# Patient Record
Sex: Female | Born: 1990 | Race: Black or African American | Hispanic: No | Marital: Single | Smoking: Never smoker
Health system: Southern US, Community
[De-identification: ages and names within clinical notes are randomized; demographics above are authoritative.]

## PROBLEM LIST (undated history)

## (undated) DIAGNOSIS — J45909 Unspecified asthma, uncomplicated: Secondary | ICD-10-CM

## (undated) DIAGNOSIS — D649 Anemia, unspecified: Secondary | ICD-10-CM

## (undated) HISTORY — DX: Anemia, unspecified: D64.9

---

## 2014-11-19 HISTORY — PX: LYMPH NODE BIOPSY: SHX201

## 2021-01-13 ENCOUNTER — Other Ambulatory Visit: Payer: Self-pay

## 2021-01-13 ENCOUNTER — Encounter: Payer: Self-pay | Admitting: Emergency Medicine

## 2021-01-13 ENCOUNTER — Emergency Department
Admission: EM | Admit: 2021-01-13 | Discharge: 2021-01-13 | Disposition: A | Discharge status: Home / Self Care | Payer: Medicaid Other | Attending: Emergency Medicine | Admitting: Emergency Medicine

## 2021-01-13 ENCOUNTER — Emergency Department: Payer: Medicaid Other

## 2021-01-13 DIAGNOSIS — J45909 Unspecified asthma, uncomplicated: Secondary | ICD-10-CM | POA: Insufficient documentation

## 2021-01-13 DIAGNOSIS — X58XXXA Exposure to other specified factors, initial encounter: Secondary | ICD-10-CM | POA: Diagnosis not present

## 2021-01-13 DIAGNOSIS — S8391XA Sprain of unspecified site of right knee, initial encounter: Secondary | ICD-10-CM | POA: Insufficient documentation

## 2021-01-13 DIAGNOSIS — S8991XA Unspecified injury of right lower leg, initial encounter: Secondary | ICD-10-CM | POA: Diagnosis present

## 2021-01-13 HISTORY — DX: Unspecified asthma, uncomplicated: J45.909

## 2021-01-13 LAB — POC URINE PREG, ED: Preg Test, Ur: NEGATIVE

## 2021-01-13 MED ORDER — IBUPROFEN 800 MG PO TABS
800.0000 mg | ORAL_TABLET | Freq: Once | ORAL | Status: AC
  Administered 2021-01-13: 800 mg via ORAL
  Filled 2021-01-13: qty 1

## 2021-01-13 MED ORDER — CYCLOBENZAPRINE HCL 10 MG PO TABS
5.0000 mg | ORAL_TABLET | Freq: Once | ORAL | Status: AC
  Administered 2021-01-13: 5 mg via ORAL
  Filled 2021-01-13: qty 1

## 2021-01-13 MED ORDER — CYCLOBENZAPRINE HCL 5 MG PO TABS: 5.0000 mg | ORAL_TABLET | Freq: Three times a day (TID) | ORAL | 0 refills | Status: DC | PRN

## 2021-01-13 MED ORDER — IBUPROFEN 800 MG PO TABS
800.0000 mg | ORAL_TABLET | Freq: Three times a day (TID) | ORAL | 0 refills | Status: DC | PRN
Start: 2021-01-13 — End: 2021-02-26

## 2021-01-13 NOTE — Discharge Instructions (Addendum)
Your exam and XR are consistent with a knee sprain. You may have tweaked your knee. Take the prescription meds as directed. Apply ice to reduce swelling. Follow-up with Ortho if your symptoms don't improve in 2 weeks.

## 2021-01-13 NOTE — ED Triage Notes (Signed)
Pt reports pain to her left knee for the past 2 days, denies injuries or falls

## 2021-01-14 NOTE — ED Provider Notes (Signed)
[facility] Emergency Department Provider Note ____________________________________________  Time seen: 2202  I have reviewed the triage vital signs and the nursing notes.  HISTORY  Chief Complaint  Knee Pain  HPI Johan Beazley is a 30 y.o. female presents herself to the ED for evaluation of some left knee pain.   Patient denies any known injury, trauma, or fall.  She reports lateral knee pain of the last 2 days.  She denies any history of chronic ongoing knee pain.  She denies any catching, clicking, locking, or giving way of the knee.  She has not had any medications in the interim for symptom relief.  Past Medical History:  Diagnosis Date  . Asthma     There are no problems to display for this patient.   History reviewed. No pertinent surgical history.  Prior to Admission medications   Medication Sig Start Date End Date Taking? Authorizing Provider  cyclobenzaprine (FLEXERIL) 5 MG tablet Take 1 tablet (5 mg total) by mouth 3 (three) times daily as needed. 01/13/21  Yes Menshew, Charlesetta Ivory, PA-C  ibuprofen (ADVIL) 800 MG tablet Take 1 tablet (800 mg total) by mouth every 8 (eight) hours as needed. 01/13/21  Yes Menshew, Charlesetta Ivory, PA-C    Allergies Patient has no allergy information on record.  History reviewed. No pertinent family history.  Social History Social History   Tobacco Use  . Smoking status: Never Smoker  . Smokeless tobacco: Never Used  Vaping Use  . Vaping Use: Some days  Substance Use Topics  . Alcohol use: Yes    Comment: social    Review of Systems  Constitutional: Negative for fever. Cardiovascular: Negative for chest pain. Respiratory: Negative for shortness of breath. Gastrointestinal: Negative for abdominal pain, vomiting and diarrhea. Genitourinary: Negative for dysuria. Musculoskeletal: Negative for back pain.  Knee pain as above. Skin: Negative for rash. Neurological: Negative for headaches, focal  weakness or numbness. ____________________________________________  PHYSICAL EXAM:  VITAL SIGNS: ED Triage Vitals  Enc Vitals Group     BP 01/13/21 2019 117/72     Pulse Rate 01/13/21 2019 (!) 112     Resp 01/13/21 2019 18     Temp 01/13/21 2019 98.3 F (36.8 C)     Temp Source 01/13/21 2019 Oral     SpO2 01/13/21 2019 100 %     Weight 01/13/21 2017 153 lb (69.4 kg)     Height 01/13/21 2017 5\' 3"  (1.6 m)     Head Circumference --      Peak Flow --      Pain Score 01/13/21 2016 10     Pain Loc --      Pain Edu? --      Excl. in GC? --     Constitutional: Alert and oriented. Well appearing and in no distress. Head: Normocephalic and atraumatic. Eyes: Conjunctivae are normal. Normal extraocular movements Cardiovascular: Normal rate, regular rhythm. Normal distal pulses. Respiratory: Normal respiratory effort.  Musculoskeletal: Left knee without obvious deformity, dislocation, or effusion.  Patient with normal active range of motion of the knee without difficulty.  Normal patella tracking without ballottement.  No significant medial or lateral joint line tenderness.  Patient has not attended manipulation of the lateral collateral ligament.  Negative anterior/posterior drawer sign.  Negative McMurray and Lachman's on exam.  No popliteal space fullness is noted.  No calf or Achilles tenderness noted distally.  Nontender with normal range of motion in all extremities.  Neurologic:  Normal  gait without ataxia. Normal speech and language. No gross focal neurologic deficits are appreciated. Skin:  Skin is warm, dry and intact. No rash noted. Psychiatric: Mood and affect are normal. Patient exhibits appropriate insight and judgment. ____________________________________________   RADIOLOGY  DG Left Knee  Negative  I, Jenise V Bacon-Menshew, personally viewed and evaluated these images (plain radiographs) as part of my medical decision making, as well as reviewing the written report by  the radiologist. ____________________________________________  PROCEDURES  Ace bandage IBU 800 mg PO Flexeril 5 mg PO  Procedures ____________________________________________  INITIAL IMPRESSION / ASSESSMENT AND PLAN / ED COURSE  DDX: knee sprain/strain, effusion, fracture, OA  Patient ED evaluation of a 2-day complaint of nontraumatic left knee pain.  Patient's exam is overall nonreturn at this time.  No signs of acute internal derangement or evidence radiologically of any fracture or dislocation, as I have personally viewed the images.  Patient will be treated for a knee sprain likely to the lateral collateral ligament.  She is placed in Ace bandage for support.  Prescriptions for ibuprofen and Flexeril are provided for benefit.  She is referred to Ortho for ongoing symptomatically.  Return precautions have been reviewed.   Fynn Tapscott was evaluated in Emergency Department on 01/14/2021 for the symptoms described in the history of present illness. She was evaluated in the context of the global COVID-19 pandemic, which necessitated consideration that the patient might be at risk for infection with the SARS-CoV-2 virus that causes COVID-19. Institutional protocols and algorithms that pertain to the evaluation of patients at risk for COVID-19 are in a state of rapid change based on information released by regulatory bodies including the CDC and federal and state organizations. These policies and algorithms were followed during the patient's care in the ED. ____________________________________________  FINAL CLINICAL IMPRESSION(S) / ED DIAGNOSES  Final diagnoses:  Sprain of right knee, unspecified ligament, initial encounter      Lissa Hoard, PA-C 01/14/21 0033    Phineas Semen, MD 01/14/21 1710

## 2021-02-26 ENCOUNTER — Other Ambulatory Visit: Payer: Self-pay

## 2021-02-26 ENCOUNTER — Emergency Department
Admission: EM | Admit: 2021-02-26 | Discharge: 2021-02-26 | Disposition: A | Discharge status: Home / Self Care | Payer: Medicaid Other | Attending: Emergency Medicine | Admitting: Emergency Medicine

## 2021-02-26 DIAGNOSIS — M79601 Pain in right arm: Secondary | ICD-10-CM | POA: Diagnosis not present

## 2021-02-26 DIAGNOSIS — J45909 Unspecified asthma, uncomplicated: Secondary | ICD-10-CM | POA: Insufficient documentation

## 2021-02-26 DIAGNOSIS — M549 Dorsalgia, unspecified: Secondary | ICD-10-CM | POA: Diagnosis not present

## 2021-02-26 DIAGNOSIS — M79602 Pain in left arm: Secondary | ICD-10-CM | POA: Insufficient documentation

## 2021-02-26 LAB — POC URINE PREG, ED: Preg Test, Ur: NEGATIVE

## 2021-02-26 LAB — URINALYSIS, COMPLETE (UACMP) WITH MICROSCOPIC
Bilirubin Urine: NEGATIVE
Glucose, UA: NEGATIVE mg/dL
Hgb urine dipstick: NEGATIVE
Ketones, ur: 5 mg/dL — AB
Leukocytes,Ua: NEGATIVE
Nitrite: NEGATIVE
Protein, ur: NEGATIVE mg/dL
Specific Gravity, Urine: 1.02 (ref 1.005–1.030)
pH: 6 (ref 5.0–8.0)

## 2021-02-26 MED ORDER — KETOROLAC TROMETHAMINE 30 MG/ML IJ SOLN
30.0000 mg | Freq: Once | INTRAMUSCULAR | Status: AC
  Administered 2021-02-26: 30 mg via INTRAMUSCULAR
  Filled 2021-02-26: qty 1

## 2021-02-26 MED ORDER — NAPROXEN 500 MG PO TABS
500.0000 mg | ORAL_TABLET | Freq: Two times a day (BID) | ORAL | 0 refills | Status: DC
Start: 2021-02-26 — End: 2021-03-31

## 2021-02-26 NOTE — ED Triage Notes (Signed)
Pt comes with c/o back pain that just started last night. Pt states pain from top of back to lower back. Pt denies any recent injuries.

## 2021-02-26 NOTE — Discharge Instructions (Signed)
Follow-up with your primary care provider or [facility] acute care if any continued problems or concerns.  A prescription for naproxen 500 mg was sent to the pharmacy to be taken with food every day for inflammation and pain.  You may take Tylenol with this medication if additional pain medication is needed.  Use warm moist compresses to your back as needed for discomfort.

## 2021-02-26 NOTE — ED Notes (Signed)
Pt c/o "shooting" back pains that travel up spine and into b/l arms. Pt c/o bruising where "sharp pains are" on b/l forearms. Minimal bruising noted.

## 2021-02-26 NOTE — ED Provider Notes (Signed)
[facility] Emergency Department Provider Note  ____________________________________________   Event Date/Time   First MD Initiated Contact with Patient 02/26/21 1246     (approximate)  I have reviewed the triage vital signs and the nursing notes.   HISTORY  Chief Complaint Back Pain   HPI Katie Kidd is a 30 y.o. female presents to the ED with complaint of back pain that travels up her spine and into her bilateral arms.  Patient states these are "sharp pains".  She denies any injury to her back and states that this started last night while doing nothing.  Patient reports that she has not take any over-the-counter medication for her pain.  She denies any urinary symptoms or previous back problems.  Patient continues to ambulate without any assistance.  Pain is a 10/10.       Past Medical History:  Diagnosis Date  . Asthma     There are no problems to display for this patient.   History reviewed. No pertinent surgical history.  Prior to Admission medications   Medication Sig Start Date End Date Taking? Authorizing Provider  naproxen (NAPROSYN) 500 MG tablet Take 1 tablet (500 mg total) by mouth 2 (two) times daily with a meal. 02/26/21  Yes Tommi Rumps, PA-C    Allergies Patient has no allergy information on record.  No family history on file.  Social History Social History   Tobacco Use  . Smoking status: Never Smoker  . Smokeless tobacco: Never Used  Vaping Use  . Vaping Use: Some days  Substance Use Topics  . Alcohol use: Yes    Comment: social  . Drug use: Never    Review of Systems Constitutional: No fever/chills Eyes: No visual changes. ENT: No complaints. Cardiovascular: Denies chest pain. Respiratory: Denies shortness of breath. Gastrointestinal: No abdominal pain.  No nausea, no vomiting.  Genitourinary: Negative for dysuria. Musculoskeletal: Positive for generalized back pain. Skin: Negative for  rash. Neurological: Negative for headaches, focal weakness or numbness.  ____________________________________________   PHYSICAL EXAM:  VITAL SIGNS: ED Triage Vitals  Enc Vitals Group     BP 02/26/21 1212 134/85     Pulse Rate 02/26/21 1212 (!) 105     Resp 02/26/21 1212 18     Temp 02/26/21 1212 98.2 F (36.8 C)     Temp Source 02/26/21 1212 Oral     SpO2 02/26/21 1212 100 %     Weight 02/26/21 1210 127 lb (57.6 kg)     Height 02/26/21 1210 5\' 3"  (1.6 m)     Head Circumference --      Peak Flow --      Pain Score 02/26/21 1210 10     Pain Loc --      Pain Edu? --      Excl. in GC? --     Constitutional: Alert and oriented. Well appearing and in no acute distress. Eyes: Conjunctivae are normal.  Head: Atraumatic. Neck: No stridor.   Cardiovascular: Normal rate, regular rhythm. Grossly normal heart sounds.  Good peripheral circulation. Respiratory: Normal respiratory effort.  No retractions. Lungs CTAB. Gastrointestinal: Soft and nontender. No distention.  No CVA tenderness. Musculoskeletal: Patient is able move upper and lower extremities with any difficulty.  There is generalized tenderness to the back both muscular and skeletal.  No rashes noted.  No abrasions or ecchymosis is present.  No soft tissue edema.  Range of motion is unrestricted.  Patient is ambulatory without any assistance.  Good muscle strength bilaterally. Neurologic:  Normal speech and language. No gross focal neurologic deficits are appreciated.  Skin:  Skin is warm, dry and intact. No rash noted. Psychiatric: Mood and affect are normal. Speech and behavior are normal.  ____________________________________________   LABS (all labs ordered are listed, but only abnormal results are displayed)  Labs Reviewed  URINALYSIS, COMPLETE (UACMP) WITH MICROSCOPIC - Abnormal; Notable for the following components:      Result Value   Color, Urine YELLOW (*)    APPearance HAZY (*)    Ketones, ur 5 (*)     Bacteria, UA RARE (*)    All other components within normal limits  POC URINE PREG, ED    PROCEDURES  Procedure(s) performed (including Critical Care):  Procedures   ____________________________________________   INITIAL IMPRESSION / ASSESSMENT AND PLAN / ED COURSE  As part of my medical decision making, I reviewed the following data within the electronic MEDICAL RECORD NUMBER Notes from prior ED visits and [place] Controlled Substance Database  30 year old female presents to the ED with complaint of back pain that began last evening without history of injury.  Patient denies any urinary symptoms and urine was negative.  On exam patient has generalized diffuse tenderness on palpation of the muscles bilaterally.  Range of motion is without restriction and no muscle spasms are noted.  Good muscle strength bilaterally.  No point tenderness on palpation of thoracic or lumbar spine.  Patient has not tried any over-the-counter medications and therefore was given an injection of Toradol 30 mg IM.  A prescription for naproxen 500 mg twice daily was sent to her pharmacy.  She is encouraged to use heat to her back as needed for discomfort and follow-up with her PCP if any continued problems.  ____________________________________________   FINAL CLINICAL IMPRESSION(S) / ED DIAGNOSES  Final diagnoses:  Musculoskeletal back pain     ED Discharge Orders         Ordered    naproxen (NAPROSYN) 500 MG tablet  2 times daily with meals        02/26/21 1428          *Please note:  Madelina Steuck was evaluated in Emergency Department on 02/26/2021 for the symptoms described in the history of present illness. She was evaluated in the context of the global COVID-19 pandemic, which necessitated consideration that the patient might be at risk for infection with the SARS-CoV-2 virus that causes COVID-19. Institutional protocols and algorithms that pertain to the evaluation of patients at risk for COVID-19  are in a state of rapid change based on information released by regulatory bodies including the CDC and federal and state organizations. These policies and algorithms were followed during the patient's care in the ED.  Some ED evaluations and interventions may be delayed as a result of limited staffing during and the pandemic.*   Note:  This document was prepared using Dragon voice recognition software and may include unintentional dictation errors.    Tommi Rumps, PA-C 02/26/21 1526    Dionne Bucy, MD 02/26/21 1529

## 2021-03-19 HISTORY — PX: CHOLECYSTECTOMY: SHX55

## 2021-03-26 ENCOUNTER — Emergency Department: Payer: Medicaid Other

## 2021-03-26 ENCOUNTER — Other Ambulatory Visit: Payer: Self-pay

## 2021-03-26 ENCOUNTER — Emergency Department
Admission: EM | Admit: 2021-03-26 | Discharge: 2021-03-26 | Disposition: A | Discharge status: Home / Self Care | Payer: Medicaid Other | Attending: Student in an Organized Health Care Education/Training Program | Admitting: Student in an Organized Health Care Education/Training Program

## 2021-03-26 DIAGNOSIS — J45909 Unspecified asthma, uncomplicated: Secondary | ICD-10-CM | POA: Diagnosis not present

## 2021-03-26 DIAGNOSIS — R079 Chest pain, unspecified: Secondary | ICD-10-CM | POA: Diagnosis present

## 2021-03-26 DIAGNOSIS — M25512 Pain in left shoulder: Secondary | ICD-10-CM | POA: Diagnosis not present

## 2021-03-26 DIAGNOSIS — R0789 Other chest pain: Secondary | ICD-10-CM | POA: Diagnosis not present

## 2021-03-26 DIAGNOSIS — R000 Tachycardia, unspecified: Secondary | ICD-10-CM | POA: Insufficient documentation

## 2021-03-26 LAB — CBC WITH DIFFERENTIAL/PLATELET
Abs Immature Granulocytes: 0.02 10*3/uL (ref 0.00–0.07)
Basophils Absolute: 0 10*3/uL (ref 0.0–0.1)
Basophils Relative: 0 %
Eosinophils Absolute: 0.1 10*3/uL (ref 0.0–0.5)
Eosinophils Relative: 1 %
HCT: 32.7 % — ABNORMAL LOW (ref 36.0–46.0)
Hemoglobin: 9.4 g/dL — ABNORMAL LOW (ref 12.0–15.0)
Immature Granulocytes: 0 %
Lymphocytes Relative: 33 %
Lymphs Abs: 2.5 10*3/uL (ref 0.7–4.0)
MCH: 18.6 pg — ABNORMAL LOW (ref 26.0–34.0)
MCHC: 28.7 g/dL — ABNORMAL LOW (ref 30.0–36.0)
MCV: 64.6 fL — ABNORMAL LOW (ref 80.0–100.0)
Monocytes Absolute: 0.5 10*3/uL (ref 0.1–1.0)
Monocytes Relative: 7 %
Neutro Abs: 4.3 10*3/uL (ref 1.7–7.7)
Neutrophils Relative %: 59 %
Platelets: 568 10*3/uL — ABNORMAL HIGH (ref 150–400)
RBC: 5.06 MIL/uL (ref 3.87–5.11)
RDW: 17.8 % — ABNORMAL HIGH (ref 11.5–15.5)
WBC: 7.4 10*3/uL (ref 4.0–10.5)
nRBC: 0 % (ref 0.0–0.2)

## 2021-03-26 LAB — TROPONIN I (HIGH SENSITIVITY)
Troponin I (High Sensitivity): <2 ng/L (ref <18)
Troponin I (High Sensitivity): <2 ng/L (ref <18)

## 2021-03-26 LAB — COMPREHENSIVE METABOLIC PANEL
ALT: 33 U/L (ref 0–44)
AST: 83 U/L — ABNORMAL HIGH (ref 15–41)
Albumin: 4.5 g/dL (ref 3.5–5.0)
Alkaline Phosphatase: 77 U/L (ref 38–126)
Anion gap: 11 (ref 5–15)
BUN: 9 mg/dL (ref 6–20)
CO2: 21 mmol/L — ABNORMAL LOW (ref 22–32)
Calcium: 9.3 mg/dL (ref 8.9–10.3)
Chloride: 107 mmol/L (ref 98–111)
Creatinine, Ser: 0.73 mg/dL (ref 0.44–1.00)
GFR, Estimated: >60 mL/min (ref >60)
Glucose, Bld: 104 mg/dL — ABNORMAL HIGH (ref 70–99)
Potassium: 3.6 mmol/L (ref 3.5–5.1)
Sodium: 139 mmol/L (ref 135–145)
Total Bilirubin: 0.8 mg/dL (ref 0.3–1.2)
Total Protein: 8.6 g/dL — ABNORMAL HIGH (ref 6.5–8.1)

## 2021-03-26 LAB — POC URINE PREG, ED: Preg Test, Ur: NEGATIVE

## 2021-03-26 MED ORDER — ALUM & MAG HYDROXIDE-SIMETH 200-200-20 MG/5ML PO SUSP
30.0000 mL | Freq: Once | ORAL | Status: AC
  Administered 2021-03-26: 30 mL via ORAL
  Filled 2021-03-26: qty 30

## 2021-03-26 MED ORDER — HYDROCODONE-ACETAMINOPHEN 5-325 MG PO TABS
1.0000 | ORAL_TABLET | ORAL | 0 refills | Status: DC | PRN
Start: 2021-03-26 — End: 2021-03-31

## 2021-03-26 MED ORDER — MORPHINE SULFATE (PF) 4 MG/ML IV SOLN
4.0000 mg | INTRAVENOUS | Status: DC | PRN
  Administered 2021-03-26: 4 mg via INTRAVENOUS
  Filled 2021-03-26: qty 1

## 2021-03-26 MED ORDER — KETOROLAC TROMETHAMINE 30 MG/ML IJ SOLN
15.0000 mg | Freq: Once | INTRAMUSCULAR | Status: AC
  Administered 2021-03-26: 15 mg via INTRAVENOUS
  Filled 2021-03-26: qty 1

## 2021-03-26 MED ORDER — ONDANSETRON HCL 4 MG/2ML IJ SOLN
4.0000 mg | Freq: Once | INTRAMUSCULAR | Status: AC
  Administered 2021-03-26: 4 mg via INTRAVENOUS
  Filled 2021-03-26: qty 2

## 2021-03-26 MED ORDER — IOHEXOL 350 MG/ML SOLN
75.0000 mL | Freq: Once | INTRAVENOUS | Status: AC | PRN
  Administered 2021-03-26: 75 mL via INTRAVENOUS

## 2021-03-26 MED ORDER — OXYCODONE-ACETAMINOPHEN 5-325 MG PO TABS
1.0000 | ORAL_TABLET | Freq: Once | ORAL | Status: AC
Start: 2021-03-26 — End: 2021-03-26
  Administered 2021-03-26: 1 via ORAL
  Filled 2021-03-26: qty 1

## 2021-03-26 MED ORDER — LIDOCAINE VISCOUS HCL 2 % MT SOLN
15.0000 mL | Freq: Once | OROMUCOSAL | Status: AC
  Administered 2021-03-26: 15 mL via ORAL
  Filled 2021-03-26: qty 15

## 2021-03-26 NOTE — ED Triage Notes (Signed)
Patient from home via [facility] with c/o intermittent chest pressure, left arm pain and upper abdominal pain.  EMS reports patient "felt these sx at 7pm last night, went to bed and woke up to use the restroom and the pain returned".  Patient is A&O x4, able to move self to stretcher, NAD noted.

## 2021-03-26 NOTE — ED Notes (Signed)
Responded to call bell. Pt needs to use bathroom and able to ambulate without assistance. Pt returned safely to stretcher and resting safely.

## 2021-03-26 NOTE — ED Provider Notes (Signed)
[facility] Emergency Department Provider Note    Event Date/Time   First MD Initiated Contact with Patient 03/26/21 0206     (approximate)  I have reviewed the triage vital signs and the nursing notes.   HISTORY  Chief Complaint Chest Pain    HPI Katie Kidd is a 30 y.o. 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  No associated nausea or vomiting or diaphoresis.  Started this afternoon.  Did slightly improve and then patient woke up this evening with more severe discomfort.  Does have some discomfort when taking deep inspiration.  Denies any lower extremity swelling.  Not on birth control.   Past Medical History:  Diagnosis Date  . Asthma    No family history on file. History reviewed. No pertinent surgical history. There are no problems to display for this patient.     Prior to Admission medications   Medication Sig Start Date End Date Taking? Authorizing Provider  HYDROcodone-acetaminophen (NORCO) 5-325 MG tablet Take 1 tablet by mouth every 4 (four) hours as needed for moderate pain. 03/26/21  Yes Willy Eddy, MD  naproxen (NAPROSYN) 500 MG tablet Take 1 tablet (500 mg total) by mouth 2 (two) times daily with a meal. 02/26/21   Tommi Rumps, PA-C    Allergies Patient has no allergy information on record.    Social History Social History   Tobacco Use  . Smoking status: Never Smoker  . Smokeless tobacco: Never Used  Vaping Use  . Vaping Use: Some days  Substance Use Topics  . Alcohol use: Yes    Comment: social  . Drug use: Never    Review of Systems Patient denies headaches, rhinorrhea, blurry vision, numbness, shortness of breath, chest pain, edema, cough, abdominal pain, nausea, vomiting, diarrhea, dysuria, fevers, rashes or hallucinations unless  otherwise stated above in HPI. ____________________________________________   PHYSICAL EXAM:  VITAL SIGNS: Vitals:   03/26/21 0530 03/26/21 0600  BP: 103/70 113/82  Pulse: 70 71  Resp: 14 13  Temp:    SpO2: 98% 100%    Constitutional: Alert and oriented.  Eyes: Conjunctivae are normal.  Head: Atraumatic. Nose: No congestion/rhinnorhea. Mouth/Throat: Mucous membranes are moist.   Neck: No stridor. Painless ROM.  Cardiovascular: Normal rate, regular rhythm. Grossly normal heart sounds.  Good peripheral circulation. Respiratory: Normal respiratory effort.  No retractions. Lungs CTAB. Gastrointestinal: Soft and nontender. No distention. No abdominal bruits. No CVA tenderness. Genitourinary:  Musculoskeletal: No lower extremity tenderness nor edema.  No joint effusions. Neurologic:  Normal speech and language. No gross focal neurologic deficits are appreciated. No facial droop Skin:  Skin is warm, dry and intact. No rash noted. Psychiatric: Mood and affect are normal. Speech and behavior are normal.  ____________________________________________   LABS (all labs ordered are listed, but only abnormal results are displayed)  Results for orders placed or performed during the hospital encounter of 03/26/21 (from the past 24 hour(s))  CBC with Differential     Status: Abnormal   Collection Time: 03/26/21  2:32 AM  Result Value Ref Range   WBC 7.4 4.0 - 10.5 K/uL   RBC 5.06 3.87 - 5.11 MIL/uL   Hemoglobin 9.4 (L) 12.0 - 15.0 g/dL   HCT 71.2 (L) 45.8 - 09.9 %   MCV 64.6 (L) 80.0 - 100.0 fL  MCH 18.6 (L) 26.0 - 34.0 pg   MCHC 28.7 (L) 30.0 - 36.0 g/dL   RDW 00.8 (H) 67.6 - 19.5 %   Platelets 568 (H) 150 - 400 K/uL   nRBC 0.0 0.0 - 0.2 %   Neutrophils Relative % 59 %   Neutro Abs 4.3 1.7 - 7.7 K/uL   Lymphocytes Relative 33 %   Lymphs Abs 2.5 0.7 - 4.0 K/uL   Monocytes Relative 7 %   Monocytes Absolute 0.5 0.1 - 1.0 K/uL   Eosinophils Relative 1 %   Eosinophils Absolute 0.1  0.0 - 0.5 K/uL   Basophils Relative 0 %   Basophils Absolute 0.0 0.0 - 0.1 K/uL   Immature Granulocytes 0 %   Abs Immature Granulocytes 0.02 0.00 - 0.07 K/uL  Comprehensive metabolic panel     Status: Abnormal   Collection Time: 03/26/21  2:32 AM  Result Value Ref Range   Sodium 139 135 - 145 mmol/L   Potassium 3.6 3.5 - 5.1 mmol/L   Chloride 107 98 - 111 mmol/L   CO2 21 (L) 22 - 32 mmol/L   Glucose, Bld 104 (H) 70 - 99 mg/dL   BUN 9 6 - 20 mg/dL   Creatinine, Ser 0.93 0.44 - 1.00 mg/dL   Calcium 9.3 8.9 - 26.7 mg/dL   Total Protein 8.6 (H) 6.5 - 8.1 g/dL   Albumin 4.5 3.5 - 5.0 g/dL   AST 83 (H) 15 - 41 U/L   ALT 33 0 - 44 U/L   Alkaline Phosphatase 77 38 - 126 U/L   Total Bilirubin 0.8 0.3 - 1.2 mg/dL   GFR, Estimated >12 >45 mL/min   Anion gap 11 5 - 15  Troponin I (High Sensitivity)     Status: None   Collection Time: 03/26/21  2:32 AM  Result Value Ref Range   Troponin I (High Sensitivity) <2 <18 ng/L  POC Urine Pregnancy, ED     Status: None   Collection Time: 03/26/21  4:59 AM  Result Value Ref Range   Preg Test, Ur Negative Negative  Troponin I (High Sensitivity)     Status: None   Collection Time: 03/26/21  5:48 AM  Result Value Ref Range   Troponin I (High Sensitivity) <2 <18 ng/L   ____________________________________________  EKG My review and personal interpretation at Time: 2:08   Indication: chesrt pain  Rate: 115  Rhythm: sinus Axis: normal Other: normal intervals, no stemi, no depressions ____________________________________________  RADIOLOGY  I personally reviewed all radiographic images ordered to evaluate for the above acute complaints and reviewed radiology reports and findings.  These findings were personally discussed with the patient.  Please see medical record for radiology report.  ____________________________________________   PROCEDURES  Procedure(s) performed:  Procedures    Critical Care performed:  no ____________________________________________   INITIAL IMPRESSION / ASSESSMENT AND PLAN / ED COURSE  Pertinent labs & imaging results that were available during my care of the patient were reviewed by me and considered in my medical decision making (see chart for details).   DDX: ACS, pericarditis, esophagitis, boerhaaves, pe, dissection, pna, bronchitis, costochondritis   Katie Kidd is a 30 y.o. who presents to the ED with presentation as described above.  Patient clinically well-appearing in no acute distress.  Describing symptoms as above.  EKG with mild tachycardia but no signs of ischemia.  She is low risk by heart score will order serial enzymes.  Initial troponin negative.  Given her history of DVT  PE not currently on anticoagulation will order CTA to evaluate for the same.  Will give pain medication.  Abdominal exam soft and benign.  The patient will be placed on continuous pulse oximetry and telemetry for monitoring.  Laboratory evaluation will be sent to evaluate for the above complaints.     Clinical Course as of 03/26/21 7989  Katie Kidd Mar 26, 2021  2119 CT imaging is reassuring.  Initial troponin negative.  No sign of infiltrates.  Peers well in no distress.  We continue to observe.  Will repeat troponin. [PR]  0640 Reassessed.  Repeat troponin negative.  Still having some mild discomfort but seems noncardiac in nature.  Discussed my recommendation for taking anti-inflammatory medication and will prescribe short course of pain medication.  We discussed follow-up with PCP.  Discussed signs and symptoms which she should return to the ER. [PR]    Clinical Course User Index [PR] Willy Eddy, MD    The patient was evaluated in Emergency Department today for the symptoms described in the history of present illness. He/she was evaluated in the context of the global COVID-19 pandemic, which necessitated consideration that the patient might be at risk for infection with the  SARS-CoV-2 virus that causes COVID-19. Institutional protocols and algorithms that pertain to the evaluation of patients at risk for COVID-19 are in a state of rapid change based on information released by regulatory bodies including the CDC and federal and state organizations. These policies and algorithms were followed during the patient's care in the ED.  As part of my medical decision making, I reviewed the following data within the electronic MEDICAL RECORD NUMBER Nursing notes reviewed and incorporated, Labs reviewed, notes from prior ED visits and [place] Controlled Substance Database   ____________________________________________   FINAL CLINICAL IMPRESSION(S) / ED DIAGNOSES  Final diagnoses:  Atypical chest pain      NEW MEDICATIONS STARTED DURING THIS VISIT:  New Prescriptions   HYDROCODONE-ACETAMINOPHEN (NORCO) 5-325 MG TABLET    Take 1 tablet by mouth every 4 (four) hours as needed for moderate pain.     Note:  This document was prepared using Dragon voice recognition software and may include unintentional dictation errors.    Willy Eddy, MD 03/26/21 [phone]

## 2021-03-26 NOTE — ED Notes (Signed)
Pt reports left side chest pain is still at 9/10 approx 1 hour after GI cocktail.

## 2021-03-27 ENCOUNTER — Other Ambulatory Visit: Payer: Self-pay

## 2021-03-27 ENCOUNTER — Emergency Department: Payer: Medicaid Other

## 2021-03-27 ENCOUNTER — Inpatient Hospital Stay
Admission: EM | Admit: 2021-03-27 | Discharge: 2021-03-31 | DRG: 418 | Disposition: A | Discharge status: Home / Self Care | Payer: Medicaid Other | Attending: General Surgery | Admitting: General Surgery

## 2021-03-27 DIAGNOSIS — R932 Abnormal findings on diagnostic imaging of liver and biliary tract: Secondary | ICD-10-CM | POA: Diagnosis not present

## 2021-03-27 DIAGNOSIS — Z86711 Personal history of pulmonary embolism: Secondary | ICD-10-CM

## 2021-03-27 DIAGNOSIS — K819 Cholecystitis, unspecified: Secondary | ICD-10-CM | POA: Diagnosis present

## 2021-03-27 DIAGNOSIS — R1011 Right upper quadrant pain: Secondary | ICD-10-CM

## 2021-03-27 DIAGNOSIS — K8000 Calculus of gallbladder with acute cholecystitis without obstruction: Secondary | ICD-10-CM | POA: Diagnosis present

## 2021-03-27 DIAGNOSIS — E876 Hypokalemia: Secondary | ICD-10-CM | POA: Diagnosis present

## 2021-03-27 DIAGNOSIS — R1010 Upper abdominal pain, unspecified: Secondary | ICD-10-CM | POA: Diagnosis not present

## 2021-03-27 DIAGNOSIS — Z20822 Contact with and (suspected) exposure to covid-19: Secondary | ICD-10-CM | POA: Diagnosis present

## 2021-03-27 DIAGNOSIS — K838 Other specified diseases of biliary tract: Secondary | ICD-10-CM

## 2021-03-27 DIAGNOSIS — K660 Peritoneal adhesions (postprocedural) (postinfection): Secondary | ICD-10-CM | POA: Diagnosis present

## 2021-03-27 DIAGNOSIS — K851 Biliary acute pancreatitis without necrosis or infection: Secondary | ICD-10-CM | POA: Diagnosis present

## 2021-03-27 DIAGNOSIS — Z86718 Personal history of other venous thrombosis and embolism: Secondary | ICD-10-CM

## 2021-03-27 LAB — BASIC METABOLIC PANEL
Anion gap: 9 (ref 5–15)
BUN: 10 mg/dL (ref 6–20)
CO2: 21 mmol/L — ABNORMAL LOW (ref 22–32)
Calcium: 9 mg/dL (ref 8.9–10.3)
Chloride: 105 mmol/L (ref 98–111)
Creatinine, Ser: 0.82 mg/dL (ref 0.44–1.00)
GFR, Estimated: >60 mL/min (ref >60)
Glucose, Bld: 141 mg/dL — ABNORMAL HIGH (ref 70–99)
Potassium: 3.8 mmol/L (ref 3.5–5.1)
Sodium: 135 mmol/L (ref 135–145)

## 2021-03-27 LAB — TROPONIN I (HIGH SENSITIVITY): Troponin I (High Sensitivity): <2 ng/L (ref <18)

## 2021-03-27 LAB — HEPATIC FUNCTION PANEL
ALT: 238 U/L — ABNORMAL HIGH (ref 0–44)
AST: 405 U/L — ABNORMAL HIGH (ref 15–41)
Albumin: 4.2 g/dL (ref 3.5–5.0)
Alkaline Phosphatase: 96 U/L (ref 38–126)
Bilirubin, Direct: 1.6 mg/dL — ABNORMAL HIGH (ref 0.0–0.2)
Indirect Bilirubin: 1.6 mg/dL — ABNORMAL HIGH (ref 0.3–0.9)
Total Bilirubin: 3.2 mg/dL — ABNORMAL HIGH (ref 0.3–1.2)
Total Protein: 8.2 g/dL — ABNORMAL HIGH (ref 6.5–8.1)

## 2021-03-27 LAB — CBC
HCT: 31.1 % — ABNORMAL LOW (ref 36.0–46.0)
Hemoglobin: 8.9 g/dL — ABNORMAL LOW (ref 12.0–15.0)
MCH: 18.5 pg — ABNORMAL LOW (ref 26.0–34.0)
MCHC: 28.6 g/dL — ABNORMAL LOW (ref 30.0–36.0)
MCV: 64.7 fL — ABNORMAL LOW (ref 80.0–100.0)
Platelets: 449 10*3/uL — ABNORMAL HIGH (ref 150–400)
RBC: 4.81 MIL/uL (ref 3.87–5.11)
RDW: 17.6 % — ABNORMAL HIGH (ref 11.5–15.5)
WBC: 7.5 10*3/uL (ref 4.0–10.5)
nRBC: 0 % (ref 0.0–0.2)

## 2021-03-27 LAB — LIPASE, BLOOD: Lipase: 1771 U/L — ABNORMAL HIGH (ref 11–51)

## 2021-03-27 LAB — RESP PANEL BY RT-PCR (FLU A&B, COVID) ARPGX2
Influenza A by PCR: NEGATIVE
Influenza B by PCR: NEGATIVE
SARS Coronavirus 2 by RT PCR: NEGATIVE

## 2021-03-27 LAB — POC URINE PREG, ED: Preg Test, Ur: NEGATIVE

## 2021-03-27 MED ORDER — HYDROMORPHONE HCL 1 MG/ML IJ SOLN
0.5000 mg | INTRAMUSCULAR | Status: DC | PRN
  Administered 2021-03-27 – 2021-03-30 (×10): 1 mg via INTRAVENOUS
  Filled 2021-03-27 (×10): qty 1

## 2021-03-27 MED ORDER — OXYCODONE HCL 5 MG PO TABS
5.0000 mg | ORAL_TABLET | ORAL | Status: DC | PRN
  Administered 2021-03-27 – 2021-03-31 (×9): 10 mg via ORAL
  Filled 2021-03-27 (×9): qty 2

## 2021-03-27 MED ORDER — ENOXAPARIN SODIUM 40 MG/0.4ML IJ SOSY
40.0000 mg | PREFILLED_SYRINGE | INTRAMUSCULAR | Status: DC
  Administered 2021-03-27 – 2021-03-28 (×2): 40 mg via SUBCUTANEOUS
  Filled 2021-03-27 (×2): qty 0.4

## 2021-03-27 MED ORDER — HYDROMORPHONE HCL 1 MG/ML IJ SOLN: 0.5000 mg | INTRAMUSCULAR | Status: DC | PRN

## 2021-03-27 MED ORDER — SODIUM CHLORIDE 0.9 % IV SOLN: INTRAVENOUS | Status: DC

## 2021-03-27 MED ORDER — INDOCYANINE GREEN 25 MG IV SOLR: 7.5000 mg | INTRAVENOUS | Status: DC

## 2021-03-27 MED ORDER — ONDANSETRON HCL 4 MG/2ML IJ SOLN
4.0000 mg | Freq: Four times a day (QID) | INTRAMUSCULAR | Status: DC | PRN
  Administered 2021-03-27 – 2021-03-30 (×5): 4 mg via INTRAVENOUS
  Filled 2021-03-27 (×5): qty 2

## 2021-03-27 MED ORDER — MORPHINE SULFATE (PF) 4 MG/ML IV SOLN
4.0000 mg | Freq: Once | INTRAVENOUS | Status: AC
  Administered 2021-03-27: 4 mg via INTRAVENOUS
  Filled 2021-03-27: qty 1

## 2021-03-27 MED ORDER — ONDANSETRON HCL 4 MG/2ML IJ SOLN
4.0000 mg | Freq: Once | INTRAMUSCULAR | Status: AC
  Administered 2021-03-27: 4 mg via INTRAVENOUS
  Filled 2021-03-27: qty 2

## 2021-03-27 MED ORDER — ACETAMINOPHEN 325 MG PO TABS: 650.0000 mg | ORAL_TABLET | Freq: Four times a day (QID) | ORAL | Status: DC | PRN

## 2021-03-27 MED ORDER — GADOBUTROL 1 MMOL/ML IV SOLN
5.0000 mL | Freq: Once | INTRAVENOUS | Status: AC | PRN
  Administered 2021-03-27: 5 mL via INTRAVENOUS

## 2021-03-27 MED ORDER — SODIUM CHLORIDE 0.9 % IV SOLN
2.0000 g | INTRAVENOUS | Status: DC
  Filled 2021-03-27: qty 20

## 2021-03-27 MED ORDER — KETOROLAC TROMETHAMINE 30 MG/ML IJ SOLN
30.0000 mg | Freq: Four times a day (QID) | INTRAMUSCULAR | Status: DC | PRN
  Administered 2021-03-27 – 2021-03-29 (×5): 30 mg via INTRAVENOUS
  Filled 2021-03-27 (×6): qty 1

## 2021-03-27 MED ORDER — PIPERACILLIN-TAZOBACTAM 3.375 G IVPB
3.3750 g | Freq: Three times a day (TID) | INTRAVENOUS | Status: DC
  Administered 2021-03-27 – 2021-03-30 (×10): 3.375 g via INTRAVENOUS
  Filled 2021-03-27 (×10): qty 50

## 2021-03-27 MED ORDER — PIPERACILLIN-TAZOBACTAM 3.375 G IVPB 30 MIN: 3.3750 g | Freq: Three times a day (TID) | INTRAVENOUS | Status: DC

## 2021-03-27 MED ORDER — ONDANSETRON 4 MG PO TBDP: 4.0000 mg | ORAL_TABLET | Freq: Four times a day (QID) | ORAL | Status: DC | PRN

## 2021-03-27 NOTE — Plan of Care (Signed)
Reviewed with patient

## 2021-03-27 NOTE — ED Triage Notes (Signed)
Pt comes with c/o severe CP, rib pain and epigastric pain. Pt states this started the other night. Pt states it has continued to get worse.  Pt states some nausea and SOB. Pt denies any vomiting or diarrhea.

## 2021-03-27 NOTE — ED Provider Notes (Signed)
[facility] Emergency Department Provider Note   ____________________________________________    I have reviewed the triage vital signs and the nursing notes.   HISTORY  Chief Complaint Chest Pain     HPI Katie Kidd is a 30 y.o. female who presents with complaints of right upper quadrant and chest discomfort.  Patient notes she was seen here recently, confirmed seen overnight on the eighth per medical records, with similar complaints, they did improve however now they have worsened again.  She describes right upper quadrant pain as well as right-sided chest pain.  Review of medical records demonstrates the patient had a normal CT angiography, normal troponins x2 during previous visit.  Has taken prescribed occasions with little improvement today.  No history of abdominal surgery  Past Medical History:  Diagnosis Date  . Asthma     Patient Active Problem List   Diagnosis Date Noted  . Cholecystitis 03/27/2021    History reviewed. No pertinent surgical history.  Prior to Admission medications   Medication Sig Start Date End Date Taking? Authorizing Provider  HYDROcodone-acetaminophen (NORCO) 5-325 MG tablet Take 1 tablet by mouth every 4 (four) hours as needed for moderate pain. 03/26/21   Willy Eddy, MD  naproxen (NAPROSYN) 500 MG tablet Take 1 tablet (500 mg total) by mouth 2 (two) times daily with a meal. 02/26/21   Tommi Rumps, PA-C     Allergies Patient has no allergy information on record.  No family history on file.  Social History Social History   Tobacco Use  . Smoking status: Never Smoker  . Smokeless tobacco: Never Used  Vaping Use  . Vaping Use: Some days  Substance Use Topics  . Alcohol use: Not Currently    Comment: social  . Drug use: Never    Review of Systems  Constitutional: No fever/chills Eyes: No visual changes.  ENT: No sore throat. Cardiovascular: As above Respiratory: Denies shortness  of breath. Gastrointestinal: As above Genitourinary: Negative for dysuria. Musculoskeletal: Negative for back pain. Skin: Negative for rash. Neurological: Negative for headaches   ____________________________________________   PHYSICAL EXAM:  VITAL SIGNS: ED Triage Vitals  Enc Vitals Group     BP 03/27/21 0737 125/83     Pulse Rate 03/27/21 0737 (!) 111     Resp 03/27/21 0737 (!) 22     Temp 03/27/21 0737 97.9 F (36.6 C)     Temp src --      SpO2 03/27/21 0737 99 %     Weight 03/27/21 0735 56.7 kg (125 lb)     Height 03/27/21 0735 1.6 m (5\' 3" )     Head Circumference --      Peak Flow --      Pain Score 03/27/21 0735 10     Pain Loc --      Pain Edu? --      Excl. in GC? --     Constitutional: Alert and oriented.   Nose: No congestion/rhinnorhea. Mouth/Throat: Mucous membranes are moist.    Cardiovascular: Normal rate, regular rhythm.   Good peripheral circulation. Respiratory: Normal respiratory effort.  No retractions. Gastrointestinal: Mild tenderness right upper quadrant, no distention.  No CVA tenderness.  Musculoskeletal: No lower extremity tenderness nor edema.  Warm and well perfused Neurologic:  Normal speech and language. No gross focal neurologic deficits are appreciated.  Skin:  Skin is warm, dry and intact. No rash noted. Psychiatric: Mood and affect are normal. Speech and behavior are normal.  ____________________________________________  LABS (all labs ordered are listed, but only abnormal results are displayed)  Labs Reviewed  BASIC METABOLIC PANEL - Abnormal; Notable for the following components:      Result Value   CO2 21 (*)    Glucose, Bld 141 (*)    All other components within normal limits  CBC - Abnormal; Notable for the following components:   Hemoglobin 8.9 (*)    HCT 31.1 (*)    MCV 64.7 (*)    MCH 18.5 (*)    MCHC 28.6 (*)    RDW 17.6 (*)    Platelets 449 (*)    All other components within normal limits  HEPATIC FUNCTION  PANEL - Abnormal; Notable for the following components:   Total Protein 8.2 (*)    AST 405 (*)    ALT 238 (*)    Total Bilirubin 3.2 (*)    Bilirubin, Direct 1.6 (*)    Indirect Bilirubin 1.6 (*)    All other components within normal limits  RESP PANEL BY RT-PCR (FLU A&B, COVID) ARPGX2  POC URINE PREG, ED  TROPONIN I (HIGH SENSITIVITY)  TROPONIN I (HIGH SENSITIVITY)   ____________________________________________  EKG  ED ECG REPORT I, Jene Every, the attending physician, personally viewed and interpreted this ECG.  Date: 03/27/2021  Rhythm: Sinus tachycardia QRS Axis: normal Intervals: normal ST/T Wave abnormalities: normal Narrative Interpretation: no evidence of acute ischemia  ____________________________________________  RADIOLOGY  Ultrasound reviewed by me, suspicious for cholecystitis ____________________________________________   PROCEDURES  Procedure(s) performed: No  Procedures   Critical Care performed: No ____________________________________________   INITIAL IMPRESSION / ASSESSMENT AND PLAN / ED COURSE  Pertinent labs & imaging results that were available during my care of the patient were reviewed by me and considered in my medical decision making (see chart for details).  Patient presents with right upper quadrant/chest pain as described above, had extensive cardiac work-up over the last 24 hours which was reassuring, low risk for ACS, not consistent with PE given normal CTA  Patient whether this may be a gallbladder issue, will send for ultrasound, IV morphine, IV Zofran ordered  Ultrasound is concerning, will add on LFTs.  Will consult surgery  Surgery will admit the patient    ____________________________________________   FINAL CLINICAL IMPRESSION(S) / ED DIAGNOSES  Final diagnoses:  Cholecystitis        Note:  This document was prepared using Dragon voice recognition software and may include unintentional dictation  errors.   Jene Every, MD 03/27/21 1048

## 2021-03-27 NOTE — ED Notes (Signed)
Pt to xray

## 2021-03-27 NOTE — ED Notes (Signed)
EDP at bedside  

## 2021-03-27 NOTE — ED Notes (Signed)
Spoke with Dr Lady Gary. Pt is still to be kept NPO because of pancreatitis.

## 2021-03-27 NOTE — ED Notes (Signed)
Called pharmacy to request to have Zosyn verified. Not available yet in pyxis. They will verify so can given as soon as possible.

## 2021-03-27 NOTE — H&P (Addendum)
[facility] SURGICAL HISTORY & PHYSICAL (cpt [phone])  HISTORY OF PRESENT ILLNESS (HPI):  30 y.o. female presented to [facility] ED today for chest pain. Patient was initially seen for complaints of chest pain on the morning of 05/08 for lower chest pain which awoke her from sleep and radiated to her left shoulder. This seemed to be exacerbated with deep inspiration. 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 Troponin were trended and negative, and she was ultimately discharged home. She reports that she felt okay when she got home, but awoke from a nap with very similar, if not worse, pain this time more in her epigastrium radiating towards her back. She endorses subjective fever, nausea, and emesis with the pain. No chill, cough, SOB, urinary changes, or bowel changes. No history of similar in the past. No previous intra-abdominal surgeries. Laboratory work up in the ED was without leukocytosis but she was found to have a mixed hyperbilirubinemia to 3.2 (direct 1.6, indirect 1.6). She underwent RUQ [place] which showed cholelithiasis and CBD dilation. MRCP was added to the work up which I have discussed with radiologist concerning for pancreatitis (likely gallstone) and cholecystitis but no choledocholithiasis. Lipase was added to laboratory work up and pending.   General surgery is consulted by emergency medicine physician Dr Jene Every, MD for evaluation and management of the above.    PAST MEDICAL HISTORY (PMH):  Past Medical History:  Diagnosis Date   Asthma     Reviewed. Otherwise negative.   PAST SURGICAL HISTORY (PSH):  History reviewed. No pertinent surgical history.  Reviewed. Otherwise negative.   MEDICATIONS:  Prior to Admission medications   Medication Sig Start Date End Date Taking? Authorizing Provider  HYDROcodone-acetaminophen (NORCO) 5-325 MG tablet Take 1 tablet by mouth every 4 (four) hours as  needed for moderate pain. 03/26/21   Willy Eddy, MD  naproxen (NAPROSYN) 500 MG tablet Take 1 tablet (500 mg total) by mouth 2 (two) times daily with a meal. 02/26/21   Tommi Rumps, PA-C     ALLERGIES:  Not on File   SOCIAL HISTORY:  Social History   Socioeconomic History   Marital status: Single    Spouse name: Not on file   Number of children: Not on file   Years of education: Not on file   Highest education level: Not on file  Occupational History   Not on file  Tobacco Use   Smoking status: Never Smoker   Smokeless tobacco: Never Used  Vaping Use   Vaping Use: Some days  Substance and Sexual Activity   Alcohol use: Not Currently    Comment: social   Drug use: Never   Sexual activity: Not on file  Other Topics Concern   Not on file  Social History Narrative   Not on file   Social Determinants of Health   Financial Resource Strain: Not on file  Food Insecurity: Not on file  Transportation Needs: Not on file  Physical Activity: Not on file  Stress: Not on file  Social Connections: Not on file  Intimate Partner Violence: Not on file     FAMILY HISTORY:  No family history on file.  Otherwise negative.   REVIEW OF SYSTEMS:  Review of Systems  Constitutional: Positive for fever (Subjective). Negative for chills.  HENT: Negative for congestion and sore throat.   Cardiovascular: Negative for chest pain and palpitations.  Gastrointestinal: Positive for  abdominal pain, nausea and vomiting. Negative for constipation and diarrhea.  Genitourinary: Negative for dysuria and urgency.  All other systems reviewed and are negative.   VITAL SIGNS:  Temp:  [97.9 F (36.6 C)] 97.9 F (36.6 C) (05/09 0737) Pulse Rate:  [107-111] 107 (05/09 0900) Resp:  [22-24] 24 (05/09 0900) BP: (104-125)/(66-83) 104/66 (05/09 0900) SpO2:  [99 %-100 %] 100 % (05/09 0900) Weight:  [56.7 kg] 56.7 kg (05/09 0735)     Height: 5\' 3"  (160 cm) Weight: 56.7 kg BMI (Calculated): 22.15    PHYSICAL EXAM:  Physical Exam Vitals and nursing note reviewed. Exam conducted with a chaperone present.  Constitutional:      General: She is not in acute distress.    Appearance: Normal appearance. She is normal weight. She is not ill-appearing.     Comments: Resting in bed, appears uncomfortable, NAD  HENT:     Head: Normocephalic and atraumatic.  Eyes:     General: No scleral icterus.    Conjunctiva/sclera: Conjunctivae normal.  Cardiovascular:     Rate and Rhythm: Regular rhythm. Tachycardia present.     Pulses: Normal pulses.     Heart sounds: No murmur heard.   Pulmonary:     Effort: Pulmonary effort is normal. No respiratory distress.  Abdominal:     General: There is no distension.     Palpations: Abdomen is soft.     Tenderness: There is abdominal tenderness in the right upper quadrant, epigastric area and periumbilical area. There is no guarding or rebound.     Comments: Abdomen is soft, she is markedly tender in the RUQ, epigastrium, and umbilicus. Non-distended, no rebound/guarding   Genitourinary:    Comments: Deferred Musculoskeletal:     Right lower leg: No edema.     Left lower leg: No edema.  Skin:    General: Skin is warm and dry.     Coloration: Skin is not jaundiced or pale.  Neurological:     General: No focal deficit present.     Mental Status: She is alert and oriented to person, place, and time.  Psychiatric:        Behavior: Behavior normal.     INTAKE/OUTPUT:  This shift: No intake/output data recorded.  Last 2 shifts: @IOLAST2SHIFTS @  Labs:  CBC Latest Ref Rng & Units 03/27/2021 03/26/2021  WBC 4.0 - 10.5 K/uL 7.5 7.4  Hemoglobin 12.0 - 15.0 g/dL 05/27/2021) 05/26/2021)  Hematocrit 36.0 - 46.0 % 31.1(L) 32.7(L)  Platelets 150 - 400 K/uL 449(H) 568(H)   CMP Latest Ref Rng & Units 03/27/2021 03/26/2021  Glucose 70 - 99 mg/dL 05/27/2021) 05/26/2021)  BUN 6 - 20 mg/dL 10 9  Creatinine 099(I - 1.00 mg/dL 338(S 5.05  Sodium 3.97 - 145 mmol/L 135 139  Potassium 3.5  - 5.1 mmol/L 3.8 3.6  Chloride 98 - 111 mmol/L 105 107  CO2 22 - 32 mmol/L 21(L) 21(L)  Calcium 8.9 - 10.3 mg/dL 9.0 9.3  Total Protein 6.5 - 8.1 g/dL - 8.6(H)  Total Bilirubin 0.3 - 1.2 mg/dL - 0.8  Alkaline Phos 38 - 126 U/L - 77  AST 15 - 41 U/L - 83(H)  ALT 0 - 44 U/L - 33     Imaging studies:   RUQ 6.73 (03/27/2021) personally reviewed showing cholelithiasis with CBD dilation, and radiologist report reviewed below:  IMPRESSION: 1. Gallstones and dilated CBD, with some intrahepatic ductal dilatation suspected in the right lobe. Suspect Choledocholithiasis. 2. Pericholecystic fluid, but gallbladder wall thickness  remains normal and no sonographic Eulah Pont sign is elicited.   MRCP (03/27/2021) personally reviewed which is concerning for pancreatitis as well as cholelithiasis with peri-cholecystitic fluid concerning for cholecystitis, and radiologist report reviewed and discussed with reading physician:  IMPRESSION: 1. Acute pancreatitis. No signs of pancreatic necrosis or pseudocyst formation. 2. Gallstones. 3. Gallbladder wall edema and pericholecystic fluid. Nonspecific in the setting of acute cholecystitis. 4. Dilated common bile duct without signs of choledocholithiasis at this time.   Assessment/Plan: (ICD-10's: K85.10) 30 y.o. female with upper abdominal pain and hyperbilirubinemia found to have likely gallstone pancreatitis and concomitant cholecystitis without choledocholithiasis.    - Will admit to general surgery  - NPO + IVF Support  - IV Abx; initially started on Rocephin but broaden to Zosyn  - Monitor abdominal examination;  - Added lipase; trend hyperbilirubinemia  - Pain control prn; antiemetics prn  - At some point, she will benefit from cholecystectomy this admission. Timing based on clinical improvement from pancreatitis standpoint   - Mobilization as toelrated    - DVT prophylaxis  All of the above findings and recommendations were discussed with the  patient, and all of her questions were answered to her expressed satisfaction.  -- Lynden Oxford, PA-C [facility] 03/27/2021, 9:07 AM [phone] M-F: 7am - 4pm  I saw and evaluated the patient.  I agree with the above documentation, exam, and plan, which I have edited where appropriate. Duanne Guess  5:25 PM

## 2021-03-27 NOTE — ED Notes (Signed)
Provided phone to pt to speak with MRI screener. °

## 2021-03-28 DIAGNOSIS — K851 Biliary acute pancreatitis without necrosis or infection: Secondary | ICD-10-CM | POA: Diagnosis not present

## 2021-03-28 DIAGNOSIS — K819 Cholecystitis, unspecified: Secondary | ICD-10-CM

## 2021-03-28 LAB — COMPREHENSIVE METABOLIC PANEL
ALT: 155 U/L — ABNORMAL HIGH (ref 0–44)
AST: 174 U/L — ABNORMAL HIGH (ref 15–41)
Albumin: 3.4 g/dL — ABNORMAL LOW (ref 3.5–5.0)
Alkaline Phosphatase: 83 U/L (ref 38–126)
Anion gap: 8 (ref 5–15)
BUN: 7 mg/dL (ref 6–20)
CO2: 22 mmol/L (ref 22–32)
Calcium: 8.2 mg/dL — ABNORMAL LOW (ref 8.9–10.3)
Chloride: 110 mmol/L (ref 98–111)
Creatinine, Ser: 0.69 mg/dL (ref 0.44–1.00)
GFR, Estimated: >60 mL/min (ref >60)
Glucose, Bld: 73 mg/dL (ref 70–99)
Potassium: 3.4 mmol/L — ABNORMAL LOW (ref 3.5–5.1)
Sodium: 140 mmol/L (ref 135–145)
Total Bilirubin: 1.6 mg/dL — ABNORMAL HIGH (ref 0.3–1.2)
Total Protein: 6.8 g/dL (ref 6.5–8.1)

## 2021-03-28 LAB — CBC
HCT: 28.8 % — ABNORMAL LOW (ref 36.0–46.0)
Hemoglobin: 8.3 g/dL — ABNORMAL LOW (ref 12.0–15.0)
MCH: 18.9 pg — ABNORMAL LOW (ref 26.0–34.0)
MCHC: 28.8 g/dL — ABNORMAL LOW (ref 30.0–36.0)
MCV: 65.8 fL — ABNORMAL LOW (ref 80.0–100.0)
Platelets: 361 10*3/uL (ref 150–400)
RBC: 4.38 MIL/uL (ref 3.87–5.11)
RDW: 17.6 % — ABNORMAL HIGH (ref 11.5–15.5)
WBC: 9 10*3/uL (ref 4.0–10.5)
nRBC: 0 % (ref 0.0–0.2)

## 2021-03-28 LAB — LIPASE, BLOOD: Lipase: 620 U/L — ABNORMAL HIGH (ref 11–51)

## 2021-03-28 MED ORDER — SODIUM CHLORIDE 0.9 % IV SOLN
12.5000 mg | Freq: Four times a day (QID) | INTRAVENOUS | Status: DC | PRN
  Administered 2021-03-28 – 2021-03-30 (×2): 12.5 mg via INTRAVENOUS
  Filled 2021-03-28 (×3): qty 0.5

## 2021-03-28 MED ORDER — INDOCYANINE GREEN 25 MG IV SOLR
7.5000 mg | INTRAVENOUS | Status: AC
  Filled 2021-03-28: qty 10

## 2021-03-28 NOTE — Progress Notes (Signed)
Mobility Specialist - Progress Note   03/28/21 1142  Mobility  Activity Ambulated in hall  Level of Assistance Independent  Assistive Device None (IV POLE)  Distance Ambulated (ft) 160 ft  Mobility Ambulated independently in hallway  Mobility Response Tolerated well  Mobility performed by Mobility specialist  $Mobility charge 1 Mobility    Pre-mobility: 108 HR, 98% SpO2 During mobility: 112 HR, 100% SpO2 Post-mobility: 105 HR, 100% SpO2   Pt supine on arrival, voicing 8/10 R side pain. Pain meds given prior to session. Pt ambulated in hallway pushing IV pole. Independent. No LOB. Denied SOB. Decreased gait speed. Pt states during ambulation, "I don't think I can make this walk without pain meds." Pain did not increase/decrease with activity. RPE 5/10.   Katie Kidd Mobility Specialist 03/28/21, 11:46 AM

## 2021-03-28 NOTE — Progress Notes (Addendum)
[facility] SURGICAL PROGRESS NOTE (cpt [phone])  Hospital Day(s): 0.   Interval History: Patient seen and examined, no acute events or new complaints overnight. Patient reports she is overall feeling better, still with abdominal pain but improved from presentation. Intermittent nausea. She denies fever, chills, emesis. She continues to remain without leukocytosis. Renal function normal with sCr - 0.69; UO - 300 + unmeasured. She does have mild hypokalemia to 3.4. Hyperbilirubinemia improved to 1.6. Lipase improved to 620. She has remained NPO since admission. Continues on Zosyn.   Review of Systems:  Constitutional: denies fever, chills  HEENT: denies cough or congestion  Respiratory: denies any shortness of breath  Cardiovascular: denies chest pain or palpitations  Gastrointestinal: + abdominal pain (improving), + nausea, denied emesis Genitourinary: denies burning with urination or urinary frequency  Vital signs in last 24 hours: [min-max] current  Temp:  [97.5 F (36.4 C)-98.8 F (37.1 C)] 98.6 F (37 C) (05/10 0520) Pulse Rate:  [83-122] 94 (05/10 0520) Resp:  [13-24] 18 (05/10 0520) BP: (92-125)/(63-83) 105/71 (05/10 0520) SpO2:  [99 %-100 %] 100 % (05/10 0520) Weight:  [56.7 kg] 56.7 kg (05/09 0735)     Height: 5\' 3"  (160 cm) Weight: 56.7 kg BMI (Calculated): 22.15   Intake/Output last 2 shifts:  05/09 0701 - 05/10 0700 In: 1908.8 [P.O.:120; I.V.:1688.8; IV Piggyback:100] Out: 350 [Urine:300; Emesis/NG output:50]   Physical Exam:  Constitutional: alert, cooperative and no distress  HENT: normocephalic without obvious abnormality  Eyes: PERRL, EOM's grossly intact and symmetric   Respiratory: breathing non-labored at rest  Cardiovascular: regular rate and sinus rhythm  Gastrointestinal: Soft, still appears uncomfortable in epigastrium but significantly improved from admission, non-distended, no rebound/guarding Musculoskeletal: no edema or wounds, motor and  sensation grossly intact, NT    Labs:  CBC Latest Ref Rng & Units 03/28/2021 03/27/2021 03/26/2021  WBC 4.0 - 10.5 K/uL 9.0 7.5 7.4  Hemoglobin 12.0 - 15.0 g/dL 8.3(L) 8.9(L) 9.4(L)  Hematocrit 36.0 - 46.0 % 28.8(L) 31.1(L) 32.7(L)  Platelets 150 - 400 K/uL 361 449(H) 568(H)   CMP Latest Ref Rng & Units 03/28/2021 03/27/2021 03/26/2021  Glucose 70 - 99 mg/dL 73 05/26/2021) 364(W)  BUN 6 - 20 mg/dL 7 10 9   Creatinine 0.44 - 1.00 mg/dL 803(O 1.22  Sodium 135 - 145 mmol/L 140 135 139  Potassium 3.5 - 5.1 mmol/L 3.4(L) 3.8 3.6  Chloride 98 - 111 mmol/L 110 105 107  CO2 22 - 32 mmol/L 22 21(L) 21(L)  Calcium 8.9 - 10.3 mg/dL 8.2(L) 9.0 9.3  Total Protein 6.5 - 8.1 g/dL 6.8 4.82) 5.00)  Total Bilirubin 0.3 - 1.2 mg/dL 3.7(C) 3.2(H) 0.8  Alkaline Phos 38 - 126 U/L 83 96 77  AST 15 - 41 U/L 174(H) 405(H) 83(H)  ALT 0 - 44 U/L 155(H) 238(H) 33     Imaging studies: No new pertinent imaging studies   Assessment/Plan: (ICD-10's: K85.10) 30 y.o. female with improvement in abdominal pain, lipase, and bilirubin levels gallstone pancreatitis and concomitant cholecystitis without choledocholithiasis   - At some point this admission she will benefit from cholecystectomy. She has made some clinical improvements overnight, but I do not think she is quite ready today. Anticipate she may be ready tomorrow (05/11). Ultimately, timing pending clinical improvements and Dr 37 availability.    - Okay for CLD today; if she has return of pain with PO intake would return to NPO  - Continue IVF support   - IV ABx (Zosyn)  -  Monitor abdominal examination;             - Trend bilirubin levels; lipase levels; improving             - Pain control prn; antiemetics prn             - Mobilization as toelrated               - DVT prophylaxis   All of the above findings and recommendations were discussed with the patient, patient's family at bedside, and the medical team, and all of patient's and family's  questions were answered to their expressed satisfaction.  -- Lynden Oxford, PA-C [facility] 03/28/2021, 6:59 AM [phone] M-F: 7am - 4pm  I saw and evaluated the patient.  I agree with the above documentation, exam, and plan, which I have edited where appropriate. Duanne Guess  5:27 PM

## 2021-03-28 NOTE — Plan of Care (Signed)
Patient states pain is better controlled than it had been with her best as 5/10.  Patient still having nausea and vomitting with antemetic onboard. MD ordered phenergan for persistent nausea. Will continue to monitor.

## 2021-03-29 DIAGNOSIS — K851 Biliary acute pancreatitis without necrosis or infection: Secondary | ICD-10-CM | POA: Diagnosis present

## 2021-03-29 DIAGNOSIS — E876 Hypokalemia: Secondary | ICD-10-CM | POA: Diagnosis present

## 2021-03-29 DIAGNOSIS — K8010 Calculus of gallbladder with chronic cholecystitis without obstruction: Secondary | ICD-10-CM | POA: Diagnosis not present

## 2021-03-29 DIAGNOSIS — K8000 Calculus of gallbladder with acute cholecystitis without obstruction: Secondary | ICD-10-CM | POA: Diagnosis present

## 2021-03-29 DIAGNOSIS — Z20822 Contact with and (suspected) exposure to covid-19: Secondary | ICD-10-CM | POA: Diagnosis present

## 2021-03-29 DIAGNOSIS — Z86711 Personal history of pulmonary embolism: Secondary | ICD-10-CM | POA: Diagnosis not present

## 2021-03-29 DIAGNOSIS — Z86718 Personal history of other venous thrombosis and embolism: Secondary | ICD-10-CM | POA: Diagnosis not present

## 2021-03-29 DIAGNOSIS — K660 Peritoneal adhesions (postprocedural) (postinfection): Secondary | ICD-10-CM | POA: Diagnosis present

## 2021-03-29 DIAGNOSIS — K819 Cholecystitis, unspecified: Secondary | ICD-10-CM | POA: Diagnosis present

## 2021-03-29 LAB — FERRITIN: Ferritin: 13 ng/mL (ref 11–307)

## 2021-03-29 LAB — CBC
HCT: 22.2 % — ABNORMAL LOW (ref 36.0–46.0)
Hemoglobin: 6.3 g/dL — ABNORMAL LOW (ref 12.0–15.0)
MCH: 18.6 pg — ABNORMAL LOW (ref 26.0–34.0)
MCHC: 28.4 g/dL — ABNORMAL LOW (ref 30.0–36.0)
MCV: 65.7 fL — ABNORMAL LOW (ref 80.0–100.0)
Platelets: 304 10*3/uL (ref 150–400)
RBC: 3.38 MIL/uL — ABNORMAL LOW (ref 3.87–5.11)
RDW: 17.8 % — ABNORMAL HIGH (ref 11.5–15.5)
WBC: 7.1 10*3/uL (ref 4.0–10.5)
nRBC: 0 % (ref 0.0–0.2)

## 2021-03-29 LAB — COMPREHENSIVE METABOLIC PANEL
ALT: 89 U/L — ABNORMAL HIGH (ref 0–44)
AST: 73 U/L — ABNORMAL HIGH (ref 15–41)
Albumin: 2.9 g/dL — ABNORMAL LOW (ref 3.5–5.0)
Alkaline Phosphatase: 70 U/L (ref 38–126)
Anion gap: 6 (ref 5–15)
BUN: <5 mg/dL — ABNORMAL LOW (ref 6–20)
CO2: 22 mmol/L (ref 22–32)
Calcium: 7.9 mg/dL — ABNORMAL LOW (ref 8.9–10.3)
Chloride: 111 mmol/L (ref 98–111)
Creatinine, Ser: 0.71 mg/dL (ref 0.44–1.00)
GFR, Estimated: >60 mL/min (ref >60)
Glucose, Bld: 87 mg/dL (ref 70–99)
Potassium: 3.6 mmol/L (ref 3.5–5.1)
Sodium: 139 mmol/L (ref 135–145)
Total Bilirubin: 0.6 mg/dL (ref 0.3–1.2)
Total Protein: 5.8 g/dL — ABNORMAL LOW (ref 6.5–8.1)

## 2021-03-29 LAB — IRON AND TIBC
Iron: 17 ug/dL — ABNORMAL LOW (ref 28–170)
Saturation Ratios: 4 % — ABNORMAL LOW (ref 10.4–31.8)
TIBC: 388 ug/dL (ref 250–450)
UIBC: 371 ug/dL

## 2021-03-29 LAB — FOLATE: Folate: 6.4 ng/mL (ref >5.9)

## 2021-03-29 LAB — LIPASE, BLOOD: Lipase: 140 U/L — ABNORMAL HIGH (ref 11–51)

## 2021-03-29 LAB — RETICULOCYTES
Immature Retic Fract: 14.1 % (ref 2.3–15.9)
RBC.: 3.95 MIL/uL (ref 3.87–5.11)
Retic Count, Absolute: 26.1 10*3/uL (ref 19.0–186.0)
Retic Ct Pct: 0.7 % (ref 0.4–3.1)

## 2021-03-29 LAB — VITAMIN B12: Vitamin B-12: 474 pg/mL (ref 180–914)

## 2021-03-29 NOTE — Progress Notes (Addendum)
[facility] SURGICAL PROGRESS NOTE (cpt [phone])  Hospital Day(s): 3   Interval History: Patient seen and examined, no acute events or new complaints overnight. Patient reports she is overall feeling better, still with abdominal pain but improved from presentation. Intermittent nausea. She denies fever, chills, emesis. She continues to remain without leukocytosis, but hemoglobin down to 6.3 (likely dilutional). She is asymptomatic, aside from some mild tachycardia, which has been present off and on since admission.  Review of Systems:  Constitutional: denies fever, chills  HEENT: denies cough or congestion  Respiratory: denies any shortness of breath  Cardiovascular: denies chest pain or palpitations  Gastrointestinal: + abdominal pain (improving), + nausea, denied emesis Genitourinary: denies burning with urination or urinary frequency  Vital signs in last 24 hours: [min-max] current  Temp:  [98.1 F (36.7 C)-98.8 F (37.1 C)] 98.3 F (36.8 C) (05/11 0754) Pulse Rate:  [89-109] 109 (05/11 0754) Resp:  [16-18] 18 (05/11 0754) BP: (82-135)/(52-95) 135/95 (05/11 0754) SpO2:  [100 %] 100 % (05/11 0754)     Height: 5\' 3"  (160 cm) Weight: 56.7 kg BMI (Calculated): 22.15   Intake/Output last 2 shifts:  05/10 0701 - 05/11 0700 In: 1276.7 [I.V.:1137.9; IV Piggyback:138.8] Out: 1000 [Urine:700; Emesis/NG output:300]   Physical Exam:  Constitutional: alert, cooperative and no distress  HENT: normocephalic without obvious abnormality  Eyes: PERRL, EOM's grossly intact and symmetric   Respiratory: breathing non-labored at rest  Cardiovascular: regular rate and sinus rhythm  Gastrointestinal: Soft, still appears uncomfortable in epigastrium but significantly improved from admission, non-distended, no rebound/guarding Musculoskeletal: no edema or wounds, motor and sensation grossly intact, NT    Labs:  CBC Latest Ref Rng & Units 03/29/2021 03/28/2021 03/27/2021  WBC 4.0 - 10.5  K/uL 7.1 9.0 7.5  Hemoglobin 12.0 - 15.0 g/dL 6.3(L) 8.3(L) 8.9(L)  Hematocrit 36.0 - 46.0 % 22.2(L) 28.8(L) 31.1(L)  Platelets 150 - 400 K/uL 304 361 449(H)   CMP Latest Ref Rng & Units 03/29/2021 03/28/2021 03/27/2021  Glucose 70 - 99 mg/dL 87 73 05/27/2021)  BUN 6 - 20 mg/dL 761(Y) 7 10  Creatinine 0.44 - 1.00 mg/dL <0(V 3.71 0.62  Sodium 135 - 145 mmol/L 139 140 135  Potassium 3.5 - 5.1 mmol/L 3.6 3.4(L) 3.8  Chloride 98 - 111 mmol/L 111 110 105  CO2 22 - 32 mmol/L 22 22 21(L)  Calcium 8.9 - 10.3 mg/dL 7.9(L) 8.2(L) 9.0  Total Protein 6.5 - 8.1 g/dL 6.94) 6.8 8.5(I)  Total Bilirubin 0.3 - 1.2 mg/dL 0.6 6.2(V) 3.2(H)  Alkaline Phos 38 - 126 U/L 70 83 96  AST 15 - 41 U/L 73(H) 174(H) 405(H)  ALT 0 - 44 U/L 89(H) 155(H) 238(H)     Imaging studies: No new pertinent imaging studies   Assessment/Plan: (ICD-10's: K85.10) 30 y.o. female with improvement in abdominal pain, lipase, and bilirubin levels gallstone pancreatitis and concomitant cholecystitis without choledocholithiasis  - no transfusion for now, as she is young, healthy and without significant medical comorbid conditions  - will stop lovenox and ketorolac and check anemia labs (microcytic, based on CBC)  - CLD for now; NPO after midnight in anticipation of surgery tomorrow.  - Continue IVF support   - IV ABx (Zosyn)  - Monitor abdominal examination;             - Trend bilirubin levels; lipase levels; improving             - Pain control prn; antiemetics prn             -  Mobilization as toelrated               - DVT prophylaxis   All of the above findings and recommendations were discussed with the patient, patient's family at bedside, and the medical team, and all of patient's and family's questions were answered to their expressed satisfaction.   

## 2021-03-29 NOTE — H&P (View-Only) (Signed)
[facility] SURGICAL PROGRESS NOTE (cpt [phone])  Hospital Day(s): 3   Interval History: Patient seen and examined, no acute events or new complaints overnight. Patient reports she is overall feeling better, still with abdominal pain but improved from presentation. Intermittent nausea. She denies fever, chills, emesis. She continues to remain without leukocytosis, but hemoglobin down to 6.3 (likely dilutional). She is asymptomatic, aside from some mild tachycardia, which has been present off and on since admission.  Review of Systems:  Constitutional: denies fever, chills  HEENT: denies cough or congestion  Respiratory: denies any shortness of breath  Cardiovascular: denies chest pain or palpitations  Gastrointestinal: + abdominal pain (improving), + nausea, denied emesis Genitourinary: denies burning with urination or urinary frequency  Vital signs in last 24 hours: [min-max] current  Temp:  [98.1 F (36.7 C)-98.8 F (37.1 C)] 98.3 F (36.8 C) (05/11 0754) Pulse Rate:  [89-109] 109 (05/11 0754) Resp:  [16-18] 18 (05/11 0754) BP: (82-135)/(52-95) 135/95 (05/11 0754) SpO2:  [100 %] 100 % (05/11 0754)     Height: 5\' 3"  (160 cm) Weight: 56.7 kg BMI (Calculated): 22.15   Intake/Output last 2 shifts:  05/10 0701 - 05/11 0700 In: 1276.7 [I.V.:1137.9; IV Piggyback:138.8] Out: 1000 [Urine:700; Emesis/NG output:300]   Physical Exam:  Constitutional: alert, cooperative and no distress  HENT: normocephalic without obvious abnormality  Eyes: PERRL, EOM's grossly intact and symmetric   Respiratory: breathing non-labored at rest  Cardiovascular: regular rate and sinus rhythm  Gastrointestinal: Soft, still appears uncomfortable in epigastrium but significantly improved from admission, non-distended, no rebound/guarding Musculoskeletal: no edema or wounds, motor and sensation grossly intact, NT    Labs:  CBC Latest Ref Rng & Units 03/29/2021 03/28/2021 03/27/2021  WBC 4.0 - 10.5  K/uL 7.1 9.0 7.5  Hemoglobin 12.0 - 15.0 g/dL 6.3(L) 8.3(L) 8.9(L)  Hematocrit 36.0 - 46.0 % 22.2(L) 28.8(L) 31.1(L)  Platelets 150 - 400 K/uL 304 361 449(H)   CMP Latest Ref Rng & Units 03/29/2021 03/28/2021 03/27/2021  Glucose 70 - 99 mg/dL 87 73 05/27/2021)  BUN 6 - 20 mg/dL 761(Y) 7 10  Creatinine 0.44 - 1.00 mg/dL <0(V 3.71 0.62  Sodium 135 - 145 mmol/L 139 140 135  Potassium 3.5 - 5.1 mmol/L 3.6 3.4(L) 3.8  Chloride 98 - 111 mmol/L 111 110 105  CO2 22 - 32 mmol/L 22 22 21(L)  Calcium 8.9 - 10.3 mg/dL 7.9(L) 8.2(L) 9.0  Total Protein 6.5 - 8.1 g/dL 6.94) 6.8 8.5(I)  Total Bilirubin 0.3 - 1.2 mg/dL 0.6 6.2(V) 3.2(H)  Alkaline Phos 38 - 126 U/L 70 83 96  AST 15 - 41 U/L 73(H) 174(H) 405(H)  ALT 0 - 44 U/L 89(H) 155(H) 238(H)     Imaging studies: No new pertinent imaging studies   Assessment/Plan: (ICD-10's: K85.10) 30 y.o. female with improvement in abdominal pain, lipase, and bilirubin levels gallstone pancreatitis and concomitant cholecystitis without choledocholithiasis  - no transfusion for now, as she is young, healthy and without significant medical comorbid conditions  - will stop lovenox and ketorolac and check anemia labs (microcytic, based on CBC)  - CLD for now; NPO after midnight in anticipation of surgery tomorrow.  - Continue IVF support   - IV ABx (Zosyn)  - Monitor abdominal examination;             - Trend bilirubin levels; lipase levels; improving             - Pain control prn; antiemetics prn             -  Mobilization as toelrated               - DVT prophylaxis   All of the above findings and recommendations were discussed with the patient, patient's family at bedside, and the medical team, and all of patient's and family's questions were answered to their expressed satisfaction.

## 2021-03-29 NOTE — Progress Notes (Signed)
Mobility Specialist - Progress Note   03/29/21 0900  Mobility  Activity Contraindicated/medical hold  Mobility performed by Mobility specialist    Pt has low HgB levels of 6.3 this date, contraindicating mobility. Will attempt session at another date/time as medically appropriate.    Filiberto Pinks Mobility Specialist 03/29/21, 9:39 AM

## 2021-03-30 ENCOUNTER — Encounter
Admission: EM | Disposition: A | Discharge status: Home / Self Care | Payer: Self-pay | Source: Home / Self Care | Attending: General Surgery

## 2021-03-30 ENCOUNTER — Inpatient Hospital Stay: Payer: Medicaid Other

## 2021-03-30 DIAGNOSIS — K8010 Calculus of gallbladder with chronic cholecystitis without obstruction: Secondary | ICD-10-CM

## 2021-03-30 LAB — COMPREHENSIVE METABOLIC PANEL
ALT: 89 U/L — ABNORMAL HIGH (ref 0–44)
AST: 78 U/L — ABNORMAL HIGH (ref 15–41)
Albumin: 3.2 g/dL — ABNORMAL LOW (ref 3.5–5.0)
Alkaline Phosphatase: 83 U/L (ref 38–126)
Anion gap: 7 (ref 5–15)
BUN: <5 mg/dL — ABNORMAL LOW (ref 6–20)
CO2: 24 mmol/L (ref 22–32)
Calcium: 8.4 mg/dL — ABNORMAL LOW (ref 8.9–10.3)
Chloride: 108 mmol/L (ref 98–111)
Creatinine, Ser: 0.64 mg/dL (ref 0.44–1.00)
GFR, Estimated: >60 mL/min (ref >60)
Glucose, Bld: 88 mg/dL (ref 70–99)
Potassium: 3.5 mmol/L (ref 3.5–5.1)
Sodium: 139 mmol/L (ref 135–145)
Total Bilirubin: 0.9 mg/dL (ref 0.3–1.2)
Total Protein: 6.2 g/dL — ABNORMAL LOW (ref 6.5–8.1)

## 2021-03-30 LAB — CBC
HCT: 25.9 % — ABNORMAL LOW (ref 36.0–46.0)
Hemoglobin: 7.6 g/dL — ABNORMAL LOW (ref 12.0–15.0)
MCH: 19 pg — ABNORMAL LOW (ref 26.0–34.0)
MCHC: 29.3 g/dL — ABNORMAL LOW (ref 30.0–36.0)
MCV: 64.9 fL — ABNORMAL LOW (ref 80.0–100.0)
Platelets: 326 10*3/uL (ref 150–400)
RBC: 3.99 MIL/uL (ref 3.87–5.11)
RDW: 18.2 % — ABNORMAL HIGH (ref 11.5–15.5)
WBC: 6 10*3/uL (ref 4.0–10.5)
nRBC: 0 % (ref 0.0–0.2)

## 2021-03-30 LAB — LIPASE, BLOOD: Lipase: 76 U/L — ABNORMAL HIGH (ref 11–51)

## 2021-03-30 SURGERY — CHOLECYSTECTOMY, ROBOT-ASSISTED, LAPAROSCOPIC
Anesthesia: General | Site: Abdomen

## 2021-03-30 MED ORDER — FENTANYL CITRATE (PF) 100 MCG/2ML IJ SOLN
INTRAMUSCULAR | Status: AC
  Filled 2021-03-30: qty 2

## 2021-03-30 MED ORDER — MIDAZOLAM HCL 2 MG/2ML IJ SOLN
INTRAMUSCULAR | Status: AC
  Filled 2021-03-30: qty 2

## 2021-03-30 MED ORDER — ONDANSETRON HCL 4 MG/2ML IJ SOLN
INTRAMUSCULAR | Status: AC
  Filled 2021-03-30: qty 2

## 2021-03-30 MED ORDER — ACETAMINOPHEN 10 MG/ML IV SOLN
INTRAVENOUS | Status: AC
  Filled 2021-03-30: qty 100

## 2021-03-30 MED ORDER — LIDOCAINE-EPINEPHRINE 1 %-1:100000 IJ SOLN
INTRAMUSCULAR | Status: AC
  Filled 2021-03-30: qty 1

## 2021-03-30 MED ORDER — DEXAMETHASONE SODIUM PHOSPHATE 10 MG/ML IJ SOLN
INTRAMUSCULAR | Status: DC | PRN
  Administered 2021-03-30: 10 mg via INTRAVENOUS

## 2021-03-30 MED ORDER — LIDOCAINE HCL (PF) 2 % IJ SOLN
INTRAMUSCULAR | Status: AC
  Filled 2021-03-30: qty 5

## 2021-03-30 MED ORDER — MIDAZOLAM HCL 2 MG/2ML IJ SOLN
INTRAMUSCULAR | Status: DC | PRN
  Administered 2021-03-30: 2 mg via INTRAVENOUS

## 2021-03-30 MED ORDER — METHOCARBAMOL 500 MG PO TABS
500.0000 mg | ORAL_TABLET | Freq: Four times a day (QID) | ORAL | Status: DC | PRN
  Administered 2021-03-31: 500 mg via ORAL
  Filled 2021-03-30: qty 1

## 2021-03-30 MED ORDER — PHENYLEPHRINE HCL (PRESSORS) 10 MG/ML IV SOLN
INTRAVENOUS | Status: DC | PRN
  Administered 2021-03-30 (×5): 100 ug via INTRAVENOUS
  Administered 2021-03-30: 200 ug via INTRAVENOUS
  Administered 2021-03-30 (×3): 100 ug via INTRAVENOUS
  Administered 2021-03-30: 200 ug via INTRAVENOUS
  Administered 2021-03-30: 100 ug via INTRAVENOUS

## 2021-03-30 MED ORDER — ROCURONIUM BROMIDE 100 MG/10ML IV SOLN
INTRAVENOUS | Status: DC | PRN
  Administered 2021-03-30: 30 mg via INTRAVENOUS
  Administered 2021-03-30: 50 mg via INTRAVENOUS

## 2021-03-30 MED ORDER — ESMOLOL HCL 100 MG/10ML IV SOLN
INTRAVENOUS | Status: DC | PRN
  Administered 2021-03-30 (×3): 10 mg via INTRAVENOUS

## 2021-03-30 MED ORDER — INDOCYANINE GREEN 25 MG IV SOLR
7.5000 mg | Freq: Once | INTRAVENOUS | Status: AC
  Administered 2021-03-30: 7.5 mg via INTRAVENOUS
  Filled 2021-03-30: qty 3

## 2021-03-30 MED ORDER — ACETAMINOPHEN 10 MG/ML IV SOLN
INTRAVENOUS | Status: DC | PRN
  Administered 2021-03-30: 1000 mg via INTRAVENOUS

## 2021-03-30 MED ORDER — FENTANYL CITRATE (PF) 100 MCG/2ML IJ SOLN
INTRAMUSCULAR | Status: DC | PRN
  Administered 2021-03-30: 100 ug via INTRAVENOUS
  Administered 2021-03-30 (×4): 25 ug via INTRAVENOUS

## 2021-03-30 MED ORDER — PIPERACILLIN-TAZOBACTAM 3.375 G IVPB
INTRAVENOUS | Status: AC
  Filled 2021-03-30: qty 50

## 2021-03-30 MED ORDER — ONDANSETRON HCL 4 MG/2ML IJ SOLN
INTRAMUSCULAR | Status: AC
  Administered 2021-03-30: 4 mg via INTRAVENOUS
  Filled 2021-03-30: qty 2

## 2021-03-30 MED ORDER — ONDANSETRON HCL 4 MG/2ML IJ SOLN
INTRAMUSCULAR | Status: DC | PRN
  Administered 2021-03-30: 4 mg via INTRAVENOUS

## 2021-03-30 MED ORDER — BUPIVACAINE HCL (PF) 0.25 % IJ SOLN
INTRAMUSCULAR | Status: AC
  Filled 2021-03-30: qty 30

## 2021-03-30 MED ORDER — LIDOCAINE-EPINEPHRINE 1 %-1:100000 IJ SOLN
INTRAMUSCULAR | Status: DC | PRN
  Administered 2021-03-30: 7 mL via INTRAMUSCULAR

## 2021-03-30 MED ORDER — SODIUM CHLORIDE 0.9 % IV SOLN
INTRAVENOUS | Status: DC | PRN
  Administered 2021-03-30: 20 ug/min via INTRAVENOUS

## 2021-03-30 MED ORDER — METOPROLOL TARTRATE 5 MG/5ML IV SOLN
INTRAVENOUS | Status: AC
  Filled 2021-03-30: qty 5

## 2021-03-30 MED ORDER — ACETAMINOPHEN 500 MG PO TABS
1000.0000 mg | ORAL_TABLET | Freq: Four times a day (QID) | ORAL | Status: DC
  Administered 2021-03-31 (×2): 1000 mg via ORAL
  Filled 2021-03-30 (×3): qty 2

## 2021-03-30 MED ORDER — FENTANYL CITRATE (PF) 100 MCG/2ML IJ SOLN
25.0000 ug | INTRAMUSCULAR | Status: DC | PRN
Start: 2021-03-30 — End: 2021-03-30

## 2021-03-30 MED ORDER — PROPOFOL 10 MG/ML IV BOLUS
INTRAVENOUS | Status: DC | PRN
  Administered 2021-03-30: 30 mg via INTRAVENOUS
  Administered 2021-03-30: 200 mg via INTRAVENOUS

## 2021-03-30 MED ORDER — HYDROMORPHONE HCL 1 MG/ML IJ SOLN
0.5000 mg | INTRAMUSCULAR | Status: DC | PRN
  Administered 2021-03-30 – 2021-03-31 (×6): 1 mg via INTRAVENOUS
  Filled 2021-03-30 (×6): qty 1

## 2021-03-30 MED ORDER — ESMOLOL HCL 100 MG/10ML IV SOLN
INTRAVENOUS | Status: AC
  Filled 2021-03-30: qty 10

## 2021-03-30 MED ORDER — DEXAMETHASONE SODIUM PHOSPHATE 10 MG/ML IJ SOLN
INTRAMUSCULAR | Status: AC
  Filled 2021-03-30: qty 1

## 2021-03-30 MED ORDER — LACTATED RINGERS IV SOLN: INTRAVENOUS | Status: DC | PRN

## 2021-03-30 MED ORDER — ONDANSETRON HCL 4 MG/2ML IJ SOLN: 4.0000 mg | Freq: Once | INTRAMUSCULAR | Status: DC | PRN

## 2021-03-30 MED ORDER — SUGAMMADEX SODIUM 200 MG/2ML IV SOLN
INTRAVENOUS | Status: DC | PRN
  Administered 2021-03-30: 200 mg via INTRAVENOUS

## 2021-03-30 MED ORDER — LIDOCAINE HCL (CARDIAC) PF 100 MG/5ML IV SOSY
PREFILLED_SYRINGE | INTRAVENOUS | Status: DC | PRN
  Administered 2021-03-30: 60 mg via INTRAVENOUS

## 2021-03-30 MED ORDER — METOPROLOL TARTRATE 5 MG/5ML IV SOLN
INTRAVENOUS | Status: DC | PRN
  Administered 2021-03-30 (×2): 1 mg via INTRAVENOUS

## 2021-03-30 MED ORDER — ROCURONIUM BROMIDE 10 MG/ML (PF) SYRINGE
PREFILLED_SYRINGE | INTRAVENOUS | Status: AC
  Filled 2021-03-30: qty 10

## 2021-03-30 MED ORDER — GLYCOPYRROLATE 0.2 MG/ML IJ SOLN
INTRAMUSCULAR | Status: DC | PRN
  Administered 2021-03-30: .2 mg via INTRAVENOUS

## 2021-03-30 MED ORDER — DEXTROSE IN LACTATED RINGERS 5 % IV SOLN: INTRAVENOUS | Status: DC

## 2021-03-30 MED ORDER — FENTANYL CITRATE (PF) 100 MCG/2ML IJ SOLN
INTRAMUSCULAR | Status: AC
  Administered 2021-03-30: 25 ug via INTRAVENOUS
  Filled 2021-03-30: qty 2

## 2021-03-30 MED ORDER — VASOPRESSIN 20 UNIT/ML IV SOLN
INTRAVENOUS | Status: AC
  Filled 2021-03-30: qty 1

## 2021-03-30 MED ORDER — DEXMEDETOMIDINE (PRECEDEX) IN NS 20 MCG/5ML (4 MCG/ML) IV SYRINGE
PREFILLED_SYRINGE | INTRAVENOUS | Status: DC | PRN
  Administered 2021-03-30: 8 ug via INTRAVENOUS
  Administered 2021-03-30: 12 ug via INTRAVENOUS

## 2021-03-30 SURGICAL SUPPLY — 60 items
"PENCIL ELECTRO HAND CTR " (MISCELLANEOUS) ×1 IMPLANT
BAG INFUSER PRESSURE 100CC (MISCELLANEOUS) ×2 IMPLANT
BLADE SURG SZ11 CARB STEEL (BLADE) ×2 IMPLANT
CANISTER SUCT 1200ML W/VALVE (MISCELLANEOUS) ×1 IMPLANT
CANNULA REDUC XI 12-8 STAPL (CANNULA) ×1
CANNULA REDUCER 12-8 DVNC XI (CANNULA) ×1 IMPLANT
CHLORAPREP W/TINT 26 (MISCELLANEOUS) ×2 IMPLANT
CLIP VESOLOCK MED LG 6/CT (CLIP) ×2 IMPLANT
COVER TIP SHEARS 8 DVNC (MISCELLANEOUS) ×1 IMPLANT
COVER TIP SHEARS 8MM DA VINCI (MISCELLANEOUS) ×1
COVER WAND RF STERILE (DRAPES) ×1 IMPLANT
DECANTER SPIKE VIAL GLASS SM (MISCELLANEOUS) ×2 IMPLANT
DEFOGGER SCOPE WARMER CLEARIFY (MISCELLANEOUS) ×2 IMPLANT
DERMABOND ADVANCED (GAUZE/BANDAGES/DRESSINGS) ×1
DERMABOND ADVANCED .7 DNX12 (GAUZE/BANDAGES/DRESSINGS) ×1 IMPLANT
DRAPE ARM DVNC X/XI (DISPOSABLE) ×4 IMPLANT
DRAPE COLUMN DVNC XI (DISPOSABLE) ×1 IMPLANT
DRAPE DA VINCI XI ARM (DISPOSABLE) ×4
DRAPE DA VINCI XI COLUMN (DISPOSABLE) ×1
ELECT CAUTERY BLADE TIP 2.5 (TIP) ×2
ELECT REM PT RETURN 9FT ADLT (ELECTROSURGICAL) ×2
ELECTRODE CAUTERY BLDE TIP 2.5 (TIP) ×1 IMPLANT
ELECTRODE REM PT RTRN 9FT ADLT (ELECTROSURGICAL) ×1 IMPLANT
GLOVE SURG ENC MOIS LTX SZ6.5 (GLOVE) ×4 IMPLANT
GLOVE SURG UNDER LTX SZ7 (GLOVE) ×4 IMPLANT
GOWN STRL REUS W/ TWL LRG LVL3 (GOWN DISPOSABLE) ×3 IMPLANT
GOWN STRL REUS W/TWL LRG LVL3 (GOWN DISPOSABLE) ×3
GRASPER SUT TROCAR 14GX15 (MISCELLANEOUS) ×1 IMPLANT
IRRIGATOR SUCT 8 DISP DVNC XI (IRRIGATION / IRRIGATOR) IMPLANT
IRRIGATOR SUCTION 8MM XI DISP (IRRIGATION / IRRIGATOR) ×1
IV NS 1000ML (IV SOLUTION) ×1
IV NS 1000ML BAXH (IV SOLUTION) IMPLANT
KIT PINK PAD W/HEAD ARE REST (MISCELLANEOUS) ×2
KIT PINK PAD W/HEAD ARM REST (MISCELLANEOUS) ×1 IMPLANT
LABEL OR SOLS (LABEL) ×2 IMPLANT
MANIFOLD NEPTUNE II (INSTRUMENTS) ×2 IMPLANT
NDL INSUFFLATION 14GA 120MM (NEEDLE) IMPLANT
NEEDLE HYPO 22GX1.5 SAFETY (NEEDLE) ×2 IMPLANT
NEEDLE INSUFFLATION 14GA 120MM (NEEDLE) IMPLANT
NS IRRIG 500ML POUR BTL (IV SOLUTION) ×2 IMPLANT
OBTURATOR OPTICAL STANDARD 8MM (TROCAR) ×1
OBTURATOR OPTICAL STND 8 DVNC (TROCAR) ×1
OBTURATOR OPTICALSTD 8 DVNC (TROCAR) ×1 IMPLANT
PACK LAP CHOLECYSTECTOMY (MISCELLANEOUS) ×2 IMPLANT
PENCIL ELECTRO HAND CTR (MISCELLANEOUS) ×2 IMPLANT
POUCH SPECIMEN RETRIEVAL 10MM (ENDOMECHANICALS) ×2 IMPLANT
SEAL CANN UNIV 5-8 DVNC XI (MISCELLANEOUS) ×3 IMPLANT
SEAL XI 5MM-8MM UNIVERSAL (MISCELLANEOUS) ×3
SET TUBE SMOKE EVAC HIGH FLOW (TUBING) ×2 IMPLANT
SOLUTION ELECTROLUBE (MISCELLANEOUS) ×2 IMPLANT
STAPLER CANNULA SEAL DVNC XI (STAPLE) ×1 IMPLANT
STAPLER CANNULA SEAL XI (STAPLE)
STRIP CLOSURE SKIN 1/2X4 (GAUZE/BANDAGES/DRESSINGS) ×2 IMPLANT
SUT MNCRL 4-0 (SUTURE) ×1
SUT MNCRL 4-0 27XMFL (SUTURE) ×1
SUT VIC AB 3-0 SH 27 (SUTURE)
SUT VIC AB 3-0 SH 27X BRD (SUTURE) IMPLANT
SUT VICRYL 0 AB UR-6 (SUTURE) IMPLANT
SUTURE MNCRL 4-0 27XMF (SUTURE) ×1 IMPLANT
TROCAR XCEL NON-BLD 5MMX100MML (ENDOMECHANICALS) IMPLANT

## 2021-03-30 NOTE — Op Note (Signed)
Operative note  Pre-operative Diagnosis: Gallstone pancreatitis, acute cholecystitis  Post-operative Diagnosis: Same  Procedure: Robot assisted laparoscopic cholecystectomy and laparoscopic adhesiolysis  Surgeon: Duanne Guess, MD  Anesthesia: GETA  Findings: Extensive adhesions of the omentum to the gallbladder and liver requiring 30 minutes of adhesiolysis prior to commencing with the cholecystectomy.  The gallbladder itself was quite edematous with friable tissues surrounding it.  Normal biliary anatomy appreciated via ICG cholangiogram.  Estimated Blood Loss: 5 cc       Specimens: Gallbladder           Complications: none immediately apparent  Procedure In Detail: The patient was seen again in the holding room. The benefits, complications, treatment options, and expected outcomes were discussed with the patient. The risks of bleeding, infection, recurrence of symptoms, failure to resolve symptoms, bile duct damage, bile duct leak, retained common bile duct stone, bowel injury, any of which could require further surgery and/or ERCP, stent, or papillotomy were reviewed with the patient. The likelihood of improving the patient's symptoms with return to their baseline status is good.  The patient and/or family concurred with the proposed plan, giving informed consent.  The patient was taken to operating room, identified and the procedure verified as laparoscopic cholecystectomy.  A time out was held and the above information confirmed.  Prior to the induction of general anesthesia, antibiotic prophylaxis was administered. VTE prophylaxis was in place. General endotracheal anesthesia was then administered and tolerated well. After the induction, the abdomen was prepped with Chloraprep and draped in the sterile fashion. The patient was positioned in the supine position.  Optiview technique was used to enter the abdomen in the right upper quadrant via a standard 5 mm laparoscopic trocar.   Pneumoperitoneum was then created with CO2 and tolerated well without any adverse changes in the patient's vital signs.  A 12 mm robotic trocar along with three 8-mm robotic trocars were placed under direct vision.  All skin incisions were infiltrated with a local anesthetic agent before making the incision and placing the trocars.   The patient was positioned in 15 degrees of reverse Trendelenburg and tilted 10 degrees to the left.  The robot was brought to the surgical field and docked in the standard fashion.  We made sure all the instrumentation was kept in direct view at all times and that there were no collision between the arms.  I scrubbed out and went to the console.  The gallbladder was shrouded in adhesions from the omentum.  These were also densely adherent to the liver.  Laparoscopic adhesiolysis was performed using blunt dissection and electrocautery until the gallbladder could be identified. Once the adhesions were free enough, the fundus of the gallbladder was grasped and retracted cephalad.  Additional adhesions were lysed bluntly and with electrocautery. The infundibulum was grasped and retracted laterally, exposing the peritoneum overlying the triangle of Calot. This was then divided and exposed in a blunt fashion. An extended critical view of the cystic duct and cystic artery was obtained.  The cystic duct was clearly identified and bluntly dissected away from the surrounding tissues, as was the cystic artery.  Using ICG cholangiography we visualized the cystic duct and confirmed that there was no aberrant biliary ductal anatomy nor any evidence of bile duct injury.  Both cystic duct and cystic artery were clipped and divided. The gallbladder was taken from the gallbladder fossa in a retrograde fashion with the electrocautery. Hemostasis was achieved with the electrocautery. Inspection of the right upper quadrant was  performed. No bleeding, bile duct injury or leak, or bowel injury was  noted.  The gallbladder was then placed in an Endopouch bag. The robotic instruments were removed and robotic arms were undocked in the standard fashion.    I scrubbed back in.  The gallbladder was removed via the 12 mm trocar site.  Using the PMI and a Carter-Thomason cone, the 12 mm port site was then closed with interrupted 0 Vicryl sutures.  The remaining 8 mm ports were removed and pneumoperitoneum was released.  Each port site was closed with deep dermal 3-0 Vicryl.  4-0 subcuticular Monocryl was used to close the skin. Dermabond was applied, followed by Steri-Strips.  The patient was then awakened, extubated, and taken to the postanesthesia recovery unit in stable condition.   Sponge, lap, and needle counts were reported to be correct number at closure and at the conclusion of the case.          Duanne Guess, MD FACS

## 2021-03-30 NOTE — Progress Notes (Addendum)
Mobility Specialist - Progress Note   03/30/21 1600  Mobility  Activity Ambulated to bathroom;Ambulated in room  Level of Assistance Minimal assist, patient does 75% or more  Assistive Device None (2-hand assist)  Distance Ambulated (ft) 50 ft  Mobility Ambulated with assistance in room  Mobility Response Tolerated well  Mobility performed by Mobility specialist  $Mobility charge 1 Mobility    Pt ambulated to bathroom with 2-hand assist for urinal output. No LOB. Noted wet sheets, mobility provided clean linen. Pt attempted to sit in recliner after ambulation, but was unable to tolerate d/t lack of comfort. Pt then transferred to [facility] for continued urinal output. States she's been urinating very frequently, BSC placed at bedside for pt's convenience. Voiced nausea and pain in abdomen 10/10, received medication prior to session. Spouse at bedside.    Filiberto Pinks Mobility Specialist 03/30/21, 4:12 PM

## 2021-03-30 NOTE — Anesthesia Preprocedure Evaluation (Addendum)
Anesthesia Evaluation  Patient identified by MRN, date of birth, ID band Patient awake    Reviewed: Allergy & Precautions, H&P , NPO status , Patient's Chart, lab work & pertinent test results, reviewed documented beta blocker date and time   History of Anesthesia Complications Negative for: history of anesthetic complications  Airway Mallampati: II  TM Distance: >3 FB Neck ROM: full    Dental  (+) Teeth Intact   Pulmonary asthma ,    Pulmonary exam normal        Cardiovascular Exercise Tolerance: Good (-) hypertension(-) Past MI and (-) CHF negative cardio ROS Normal cardiovascular exam(-) dysrhythmias (-) Valvular Problems/Murmurs Rhythm:regular Rate:Normal     Neuro/Psych TIA (Facial droop, weakness on R wided weakness, resolved after a couple of hours, on blood thinner for several weeks, no further tx. )negative psych ROS   GI/Hepatic negative GI ROS, Neg liver ROS,   Endo/Other  negative endocrine ROS  Renal/GU negative Renal ROS  negative genitourinary   Musculoskeletal   Abdominal   Peds  Hematology negative hematology ROS (+)   Anesthesia Other Findings Past Medical History: No date: Asthma History reviewed. No pertinent surgical history. BMI    Body Mass Index: 22.14 kg/m     Reproductive/Obstetrics negative OB ROS                            Anesthesia Physical Anesthesia Plan  ASA: II  Anesthesia Plan: General ETT   Post-op Pain Management:    Induction:   PONV Risk Score and Plan:   Airway Management Planned: Oral ETT  Additional Equipment:   Intra-op Plan:   Post-operative Plan:   Informed Consent: I have reviewed the patients History and Physical, chart, labs and discussed the procedure including the risks, benefits and alternatives for the proposed anesthesia with the patient or authorized representative who has indicated his/her understanding and  acceptance.     Dental Advisory Given  Plan Discussed with: CRNA  Anesthesia Plan Comments:        Anesthesia Quick Evaluation

## 2021-03-30 NOTE — Progress Notes (Signed)
Mobility Specialist - Progress Note   03/30/21 1408  Mobility  Activity Off unit  Mobility performed by Mobility specialist    Pt off unit for procedure at this time. Will attempt session another date/time as pt is available.    Filiberto Pinks Mobility Specialist 03/30/21, 2:08 PM

## 2021-03-30 NOTE — Anesthesia Procedure Notes (Signed)
Procedure Name: Intubation Date/Time: 03/30/2021 11:43 AM Performed by: Mohammed Kindle, CRNA Pre-anesthesia Checklist: Patient identified, Emergency Drugs available, Suction available and Patient being monitored Patient Re-evaluated:Patient Re-evaluated prior to induction Oxygen Delivery Method: Circle system utilized Preoxygenation: Pre-oxygenation with 100% oxygen Induction Type: IV induction Ventilation: Mask ventilation without difficulty Laryngoscope Size: McGraph and 3 Grade View: Grade I Tube type: Oral Tube size: 6.5 mm Number of attempts: 1 Airway Equipment and Method: Stylet and Oral airway Placement Confirmation: ETT inserted through vocal cords under direct vision,  positive ETCO2 and breath sounds checked- equal and bilateral Tube secured with: Tape Dental Injury: Teeth and Oropharynx as per pre-operative assessment

## 2021-03-30 NOTE — Transfer of Care (Signed)
Immediate Anesthesia Transfer of Care Note  Patient: Katie Kidd  Procedure(s) Performed: XI ROBOTIC ASSISTED LAPAROSCOPIC CHOLECYSTECTOMY (N/A Abdomen)  Patient Location: PACU  Anesthesia Type:General  Level of Consciousness: awake, drowsy and patient cooperative  Airway & Oxygen Therapy: Patient Spontanous Breathing and Patient connected to face mask oxygen  Post-op Assessment: Report given to RN and Post -op Vital signs reviewed and stable  Post vital signs: Reviewed and stable  Last Vitals:  Vitals Value Taken Time  BP 106/35 03/30/21 1348  Temp 36.8 C 03/30/21 1348  Pulse 101 03/30/21 1350  Resp 13 03/30/21 1352  SpO2 100 % 03/30/21 1350  Vitals shown include unvalidated device data.  Last Pain:  Vitals:   03/30/21 1116  TempSrc: Temporal  PainSc: 0-No pain      Patients Stated Pain Goal: 0 (03/29/21 2200)  Complications: No complications documented.

## 2021-03-30 NOTE — Plan of Care (Signed)

## 2021-03-30 NOTE — Interval H&P Note (Signed)
History and Physical Interval Note:  03/30/2021 11:05 AM  Katie Kidd  has presented today for surgery, with the diagnosis of Gallstone Pancreatitis.  The various methods of treatment have been discussed with the patient and family. After consideration of risks, benefits and other options for treatment, the patient has consented to  Procedure(s): XI ROBOTIC ASSISTED LAPAROSCOPIC CHOLECYSTECTOMY (N/A) as a surgical intervention.  The patient's history has been reviewed, patient examined, no change in status, stable for surgery.  I have reviewed the patient's chart and labs.  Questions were answered to the patient's satisfaction.     Duanne Guess

## 2021-03-30 NOTE — OR Nursing (Signed)
1244: Updated family on patient status. Spoke with wife.

## 2021-03-31 LAB — CBC
HCT: 26.3 % — ABNORMAL LOW (ref 36.0–46.0)
Hemoglobin: 7.7 g/dL — ABNORMAL LOW (ref 12.0–15.0)
MCH: 18.6 pg — ABNORMAL LOW (ref 26.0–34.0)
MCHC: 29.3 g/dL — ABNORMAL LOW (ref 30.0–36.0)
MCV: 63.7 fL — ABNORMAL LOW (ref 80.0–100.0)
Platelets: 332 10*3/uL (ref 150–400)
RBC: 4.13 MIL/uL (ref 3.87–5.11)
RDW: 18.2 % — ABNORMAL HIGH (ref 11.5–15.5)
WBC: 9 10*3/uL (ref 4.0–10.5)
nRBC: 0 % (ref 0.0–0.2)

## 2021-03-31 LAB — COMPREHENSIVE METABOLIC PANEL
ALT: 94 U/L — ABNORMAL HIGH (ref 0–44)
AST: 95 U/L — ABNORMAL HIGH (ref 15–41)
Albumin: 3.2 g/dL — ABNORMAL LOW (ref 3.5–5.0)
Alkaline Phosphatase: 82 U/L (ref 38–126)
Anion gap: 5 (ref 5–15)
BUN: <5 mg/dL — ABNORMAL LOW (ref 6–20)
CO2: 27 mmol/L (ref 22–32)
Calcium: 8.8 mg/dL — ABNORMAL LOW (ref 8.9–10.3)
Chloride: 108 mmol/L (ref 98–111)
Creatinine, Ser: 0.67 mg/dL (ref 0.44–1.00)
GFR, Estimated: >60 mL/min (ref >60)
Glucose, Bld: 135 mg/dL — ABNORMAL HIGH (ref 70–99)
Potassium: 3.8 mmol/L (ref 3.5–5.1)
Sodium: 140 mmol/L (ref 135–145)
Total Bilirubin: 0.7 mg/dL (ref 0.3–1.2)
Total Protein: 6.8 g/dL (ref 6.5–8.1)

## 2021-03-31 LAB — LIPASE, BLOOD: Lipase: 250 U/L — ABNORMAL HIGH (ref 11–51)

## 2021-03-31 LAB — SURGICAL PATHOLOGY

## 2021-03-31 LAB — PHOSPHORUS: Phosphorus: 2.5 mg/dL (ref 2.5–4.6)

## 2021-03-31 LAB — MAGNESIUM: Magnesium: 1.6 mg/dL — ABNORMAL LOW (ref 1.7–2.4)

## 2021-03-31 MED ORDER — OXYCODONE HCL 5 MG PO TABS: 5.0000 mg | ORAL_TABLET | Freq: Four times a day (QID) | ORAL | 0 refills | Status: DC | PRN

## 2021-03-31 MED ORDER — IBUPROFEN 600 MG PO TABS: 600.0000 mg | ORAL_TABLET | Freq: Four times a day (QID) | ORAL | 0 refills | Status: DC | PRN

## 2021-03-31 MED ORDER — METHOCARBAMOL 500 MG PO TABS: 500.0000 mg | ORAL_TABLET | Freq: Four times a day (QID) | ORAL | 0 refills | Status: DC | PRN

## 2021-03-31 NOTE — Discharge Instructions (Signed)
In addition to included general post-operative instructions,  Diet: Resume home diet. Recommend avoiding or limiting fatty/greasy foods over the next few days/week. If you do eat these, you may (or may not) notice diarrhea. This is expected while your body adjusts to not having a gallbladder, and it typically resolves with time.    Activity: No heavy lifting >20 pounds (children, pets, laundry, garbage) for 4 weeks, but light activity and walking are encouraged. Do not drive or drink alcohol if taking narcotic pain medications or having pain that might distract from driving.  Wound care: 2 days after surgery (05/14), you may shower/get incision wet with soapy water and pat dry (do not rub incisions), but no baths or submerging incision underwater until follow-up.   Medications: Resume all home medications. For mild to moderate pain: acetaminophen (Tylenol) or ibuprofen/naproxen (if no kidney disease). Combining Tylenol with alcohol can substantially increase your risk of causing liver disease. Narcotic pain medications, if prescribed, can be used for severe pain, though may cause nausea, constipation, and drowsiness. Do not combine Tylenol and Percocet (or similar) within a 6 hour period as Percocet (and similar) contain(s) Tylenol. If you do not need the narcotic pain medication, you do not need to fill the prescription.  Call office [phone] / [phone]) at any time if any questions, worsening pain, fevers/chills, bleeding, drainage from incision site, or other concerns.

## 2021-03-31 NOTE — Discharge Summary (Addendum)
[facility] SUR[facility] DISCHARGE SUMMARY  Patient ID: Katie Kidd MRN: [id number] DOB/AGE: [birth year] 30 y.o.  Admit date: 03/27/2021 Discharge date: 03/31/2021  Discharge Diagnoses Patient Active Problem List   Diagnosis Date Noted   Acute gallstone pancreatitis 03/29/2021   Gallstone pancreatitis 03/27/2021    Consultants None  Procedures 03/30/2021:  Robotic assisted laparoscopic cholecystectomy and laparoscopic adhesiolysis  HPI: 30 y.o. female presented to [facility] ED today for chest pain. Patient was initially seen for complaints of chest pain on the morning of 05/08 for lower chest pain which awoke her from sleep and radiated to her left shoulder. This seemed to be exacerbated with deep inspiration. 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 Troponin were trended and negative, and she was ultimately discharged home. She reports that she felt okay when she got home, but awoke from a nap with very similar, if not worse, pain this time more in her epigastrium radiating towards her back. She endorses subjective fever, nausea, and emesis with the pain. No chill, cough, SOB, urinary changes, or bowel changes. No history of similar in the past. No previous intra-abdominal surgeries. Laboratory work up in the ED was without leukocytosis but she was found to have a mixed hyperbilirubinemia to 3.2 (direct 1.6, indirect 1.6). She underwent RUQ [place] which showed cholelithiasis and CBD dilation. MRCP was added to the work up which I have discussed with radiologist concerning for pancreatitis (likely gallstone) and cholecystitis but no choledocholithiasis. Lipase was added to laboratory work up and pending.   Hospital Course: Informed consent was obtained and documented, and patient underwent uneventful Robotic assisted laparoscopic cholecystectomy and laparoscopic adhesiolysis (Dr Lady Gary, 03/30/2021).   Post-operatively, patient's pain/symtpoms improved/resolved and advancement of patient's diet and ambulation were well-tolerated. The remainder of patient's hospital course was essentially unremarkable, and discharge planning was initiated accordingly with patient safely able to be discharged home with appropriate discharge instructions, pain control, and outpatient follow-up after all of her questions were answered to her expressed satisfaction.   Discharge Condition: Good   Physical Examination:  Constitutional: Well appearing female, NAD Pulmonary: Normal effort, no respiratory distress Gastrointestinal: Soft, incisional soreness, non-distended, no rebound/guarding Skin: Laparoscopic incisions are CDI with steri-strips, no erythema or drainage    Allergies as of 03/31/2021   No Known Allergies      Medication List     STOP taking these medications    HYDROcodone-acetaminophen 5-325 MG tablet Commonly known as: Norco   naproxen 500 MG tablet Commonly known as: Naprosyn       TAKE these medications    acetaminophen 500 MG tablet Commonly known as: TYLENOL Take 500 mg by mouth every 6 (six) hours as needed for moderate pain, mild pain, fever or headache.   ibuprofen 600 MG tablet Commonly known as: ADVIL Take 1 tablet (600 mg total) by mouth every 6 (six) hours as needed.   methocarbamol 500 MG tablet Commonly known as: ROBAXIN Take 1 tablet (500 mg total) by mouth every 6 (six) hours as needed for muscle spasms.   oxyCODONE 5 MG immediate release tablet Commonly known as: Oxy IR/ROXICODONE Take 1 tablet (5 mg total) by mouth every 6 (six) hours as needed for severe pain or breakthrough pain.          Follow-up Information     Donovan Kail, PA-C. Schedule an appointment as soon as possible for a visit in 2 week(s).  Specialty: Physician Assistant Why: s/p lap chole, okay for virtual visit, patient going back to [place] with spouse  Contact  information: [address] [place] [address] [phone]                  Time spent on discharge management including discussion of hospital course, clinical condition, outpatient instructions, prescriptions, and follow up with the patient and members of the medical team: >30 minutes  -- Lynden Oxford , PA-C [facility]  03/31/2021, 1:23 PM [phone] M-F: 7am - 4pm  I saw and evaluated the patient.  I agree with the above documentation, exam, and plan, which I have edited where appropriate. Duanne Guess  7:16 AM

## 2021-03-31 NOTE — Progress Notes (Signed)
Mobility Specialist - Progress Note   03/31/21 1300  Mobility  Activity Ambulated in hall  Level of Assistance Independent  Assistive Device None  Distance Ambulated (ft) 160 ft  Mobility Ambulated independently in hallway  Mobility Response Tolerated well  Mobility performed by Mobility specialist  $Mobility charge 1 Mobility    Pt ambulated in hallway independently. No LOB. Reports pain 10/10 at incision site with no increase/decrease during ambulation. Medication given just prior to session. Spouse at bedside. Anticipating d/c this date.    Katie Kidd Mobility Specialist 03/31/21, 1:46 PM

## 2021-04-01 NOTE — Anesthesia Postprocedure Evaluation (Signed)
Anesthesia Post Note  Patient: Psychiatric nurse) Performed: XI ROBOTIC ASSISTED LAPAROSCOPIC CHOLECYSTECTOMY (N/A Abdomen)  Patient location during evaluation: PACU Anesthesia Type: General Level of consciousness: awake and alert Pain management: pain level controlled Vital Signs Assessment: post-procedure vital signs reviewed and stable Respiratory status: spontaneous breathing, nonlabored ventilation, respiratory function stable and patient connected to nasal cannula oxygen Cardiovascular status: blood pressure returned to baseline and stable Postop Assessment: no apparent nausea or vomiting Anesthetic complications: no   No complications documented.   Last Vitals:  Vitals:   03/31/21 0851 03/31/21 1113  BP: 117/71 113/69  Pulse: 99 99  Resp: 16   Temp: 36.8 C 36.6 C  SpO2: 100% 100%    Last Pain:  Vitals:   03/31/21 1331  TempSrc:   PainSc: 10-Worst pain ever                 Yevette Edwards

## 2021-04-03 ENCOUNTER — Telehealth: Payer: Self-pay | Admitting: General Surgery

## 2021-04-03 NOTE — Telephone Encounter (Signed)
Letter has been sent to pt's mychart. Pt notified. Verbalizes understanding.

## 2021-04-03 NOTE — Telephone Encounter (Signed)
Patient is calling and is asking for a return to work note, and is wanting to return on the 21st of May,  She is asking for the note to be uploaded to [employer]. Please call and advise.

## 2021-04-15 ENCOUNTER — Other Ambulatory Visit: Payer: Self-pay

## 2021-04-15 DIAGNOSIS — J45909 Unspecified asthma, uncomplicated: Secondary | ICD-10-CM | POA: Insufficient documentation

## 2021-04-15 DIAGNOSIS — R1012 Left upper quadrant pain: Secondary | ICD-10-CM | POA: Diagnosis present

## 2021-04-15 DIAGNOSIS — K851 Biliary acute pancreatitis without necrosis or infection: Secondary | ICD-10-CM | POA: Insufficient documentation

## 2021-04-15 NOTE — ED Triage Notes (Signed)
Pt states had gallbladder removed on 03/30/21 and is having llq abd pain. Pt states area feels hard to touch. Pt denies vomiting, fever.

## 2021-04-16 ENCOUNTER — Emergency Department: Payer: Medicaid Other

## 2021-04-16 ENCOUNTER — Emergency Department
Admission: EM | Admit: 2021-04-16 | Discharge: 2021-04-16 | Disposition: A | Discharge status: Home / Self Care | Payer: Medicaid Other | Attending: Emergency Medicine | Admitting: Emergency Medicine

## 2021-04-16 DIAGNOSIS — K851 Biliary acute pancreatitis without necrosis or infection: Secondary | ICD-10-CM

## 2021-04-16 LAB — COMPREHENSIVE METABOLIC PANEL
ALT: 13 U/L (ref 0–44)
AST: 25 U/L (ref 15–41)
Albumin: 4.3 g/dL (ref 3.5–5.0)
Alkaline Phosphatase: 75 U/L (ref 38–126)
Anion gap: 9 (ref 5–15)
BUN: 12 mg/dL (ref 6–20)
CO2: 25 mmol/L (ref 22–32)
Calcium: 9.2 mg/dL (ref 8.9–10.3)
Chloride: 103 mmol/L (ref 98–111)
Creatinine, Ser: 0.81 mg/dL (ref 0.44–1.00)
GFR, Estimated: >60 mL/min (ref >60)
Glucose, Bld: 101 mg/dL — ABNORMAL HIGH (ref 70–99)
Potassium: 3.8 mmol/L (ref 3.5–5.1)
Sodium: 137 mmol/L (ref 135–145)
Total Bilirubin: 0.8 mg/dL (ref 0.3–1.2)
Total Protein: 8.2 g/dL — ABNORMAL HIGH (ref 6.5–8.1)

## 2021-04-16 LAB — URINALYSIS, COMPLETE (UACMP) WITH MICROSCOPIC
Bacteria, UA: NONE SEEN
Bilirubin Urine: NEGATIVE
Glucose, UA: NEGATIVE mg/dL
Hgb urine dipstick: NEGATIVE
Ketones, ur: NEGATIVE mg/dL
Leukocytes,Ua: NEGATIVE
Nitrite: NEGATIVE
Protein, ur: NEGATIVE mg/dL
Specific Gravity, Urine: 1.025 (ref 1.005–1.030)
pH: 5 (ref 5.0–8.0)

## 2021-04-16 LAB — CBC
HCT: 26.9 % — ABNORMAL LOW (ref 36.0–46.0)
Hemoglobin: 7.6 g/dL — ABNORMAL LOW (ref 12.0–15.0)
MCH: 18.4 pg — ABNORMAL LOW (ref 26.0–34.0)
MCHC: 28.3 g/dL — ABNORMAL LOW (ref 30.0–36.0)
MCV: 65 fL — ABNORMAL LOW (ref 80.0–100.0)
Platelets: 832 10*3/uL — ABNORMAL HIGH (ref 150–400)
RBC: 4.14 MIL/uL (ref 3.87–5.11)
RDW: 18.3 % — ABNORMAL HIGH (ref 11.5–15.5)
WBC: 6.8 10*3/uL (ref 4.0–10.5)
nRBC: 0 % (ref 0.0–0.2)

## 2021-04-16 LAB — LIPASE, BLOOD: Lipase: 62 U/L — ABNORMAL HIGH (ref 11–51)

## 2021-04-16 LAB — POC URINE PREG, ED: Preg Test, Ur: NEGATIVE

## 2021-04-16 MED ORDER — DOCUSATE SODIUM 100 MG PO CAPS: ORAL_CAPSULE | ORAL | 0 refills | Status: DC

## 2021-04-16 MED ORDER — OXYCODONE-ACETAMINOPHEN 5-325 MG PO TABS: 2.0000 | ORAL_TABLET | Freq: Four times a day (QID) | ORAL | 0 refills | Status: DC | PRN

## 2021-04-16 MED ORDER — ONDANSETRON 4 MG PO TBDP: ORAL_TABLET | ORAL | 0 refills | Status: DC

## 2021-04-16 MED ORDER — OXYCODONE-ACETAMINOPHEN 5-325 MG PO TABS
2.0000 | ORAL_TABLET | Freq: Once | ORAL | Status: AC
  Administered 2021-04-16: 2 via ORAL
  Filled 2021-04-16: qty 2

## 2021-04-16 NOTE — ED Provider Notes (Signed)
[facility] Emergency Department Provider Note  ____________________________________________   Event Date/Time   First MD Initiated Contact with Patient 04/16/21 0023     (approximate)  I have reviewed the triage vital signs and the nursing notes.   HISTORY  Chief Complaint Abdominal Pain    HPI Katie Kidd is a 30 y.o. female who was admitted and had a cholecystectomy about 2 weeks ago for gallstone pancreatitis and presents tonight for gradually worsening left upper quadrant pain.  She said that she has some pain that is sort of a stretching or occasionally sharp pain that seems to be underneath one of the surgical scars.  He does not have any pain in her upper abdomen or on the right lower quadrant.  No swelling.  She has had some nausea but no vomiting.  She denies fever, sore throat, chest pain, shortness of breath.  She has not yet had a follow-up appointment with her surgeon but that is scheduled for 2 days from now.  Nothing particular seems to make the symptoms better or worse.  She does not drink alcohol but had  pancreatitis at the time of her surgery that seem to be due to her gallstones.        Past Medical History:  Diagnosis Date  . Asthma     Patient Active Problem List   Diagnosis Date Noted  . Acute gallstone pancreatitis 03/29/2021  . Gallstone pancreatitis 03/27/2021    No past surgical history on file.  Prior to Admission medications   Medication Sig Start Date End Date Taking? Authorizing Provider  docusate sodium (COLACE) 100 MG capsule Take 1 tablet once or twice daily as needed for constipation while taking narcotic pain medicine 04/16/21  Yes Loleta Rose, MD  ondansetron (ZOFRAN ODT) 4 MG disintegrating tablet Allow 1-2 tablets to dissolve in your mouth every 8 hours as needed for nausea/vomiting 04/16/21  Yes Loleta Rose, MD  oxyCODONE-acetaminophen (PERCOCET) 5-325 MG tablet Take 2 tablets by mouth every 6 (six)  hours as needed for severe pain. 04/16/21  Yes Loleta Rose, MD  acetaminophen (TYLENOL) 500 MG tablet Take 500 mg by mouth every 6 (six) hours as needed for moderate pain, mild pain, fever or headache.    [provider]  ibuprofen (ADVIL) 600 MG tablet Take 1 tablet (600 mg total) by mouth every 6 (six) hours as needed. 03/31/21   Donovan Kail, PA-C  methocarbamol (ROBAXIN) 500 MG tablet Take 1 tablet (500 mg total) by mouth every 6 (six) hours as needed for muscle spasms. 03/31/21   Donovan Kail, PA-C  oxyCODONE (OXY IR/ROXICODONE) 5 MG immediate release tablet Take 1 tablet (5 mg total) by mouth every 6 (six) hours as needed for severe pain or breakthrough pain. 03/31/21   Donovan Kail, PA-C    Allergies Patient has no known allergies.  No family history on file.  Social History Social History   Tobacco Use  . Smoking status: Never Smoker  . Smokeless tobacco: Never Used  Vaping Use  . Vaping Use: Some days  Substance Use Topics  . Alcohol use: Not Currently    Comment: social  . Drug use: Never    Review of Systems Constitutional: No fever/chills Eyes: No visual changes. ENT: No sore throat. Cardiovascular: Denies chest pain. Respiratory: Denies shortness of breath. Gastrointestinal: Left upper quadrant abdominal pain 2-week status postcholecystectomy.  Nausea, no vomiting. Genitourinary: Negative for dysuria. Musculoskeletal: Negative for neck pain.  Negative for back  pain. Integumentary: Negative for rash. Neurological: Negative for headaches, focal weakness or numbness.   ____________________________________________   PHYSICAL EXAM:  VITAL SIGNS: ED Triage Vitals  Enc Vitals Group     BP 04/15/21 2355 135/83     Pulse Rate 04/15/21 2355 (!) 102     Resp 04/15/21 2355 16     Temp 04/15/21 2355 98.2 F (36.8 C)     Temp src --      SpO2 04/15/21 2355 100 %     Weight 04/15/21 2356 54.4 kg (120 lb)     Height 04/15/21 2356 1.6 m (5\' 3" )      Head Circumference --      Peak Flow --      Pain Score 04/15/21 2356 10     Pain Loc --      Pain Edu? --      Excl. in GC? --     Constitutional: Alert and oriented.  Eyes: Conjunctivae are normal.  Head: Atraumatic. Nose: No congestion/rhinnorhea. Mouth/Throat: Patient is wearing a mask. Neck: No stridor.  No meningeal signs.   Cardiovascular: Normal rate, regular rhythm. Good peripheral circulation. Respiratory: Normal respiratory effort.  No retractions. Gastrointestinal: Soft and nondistended.  Well-appearing surgical wounds.  Mild tenderness to palpation in the left upper quadrant.  No epigastric tenderness to palpation nor right upper quadrant tenderness. Musculoskeletal: No lower extremity tenderness nor edema. No gross deformities of extremities. Neurologic:  Normal speech and language. No gross focal neurologic deficits are appreciated.  Skin:  Skin is warm, dry and intact. Psychiatric: Mood and affect are normal. Speech and behavior are normal.  ____________________________________________   LABS (all labs ordered are listed, but only abnormal results are displayed)  Labs Reviewed  COMPREHENSIVE METABOLIC PANEL - Abnormal; Notable for the following components:      Result Value   Glucose, Bld 101 (*)    Total Protein 8.2 (*)    All other components within normal limits  CBC - Abnormal; Notable for the following components:   Hemoglobin 7.6 (*)    HCT 26.9 (*)    MCV 65.0 (*)    MCH 18.4 (*)    MCHC 28.3 (*)    RDW 18.3 (*)    Platelets 832 (*)    All other components within normal limits  URINALYSIS, COMPLETE (UACMP) WITH MICROSCOPIC - Abnormal; Notable for the following components:   Color, Urine YELLOW (*)    APPearance HAZY (*)    All other components within normal limits  LIPASE, BLOOD - Abnormal; Notable for the following components:   Lipase 62 (*)    All other components within normal limits  POC URINE PREG, ED    ____________________________________________  EKG  No indication for emergent EKG ____________________________________________  RADIOLOGY I, 04/17/21, personally viewed and evaluated these images (plain radiographs) as part of my medical decision making, as well as reviewing the written report by the radiologist.  ED MD interpretation: Postoperative changes status postcholecystectomy.  Small amount of free fluid in pelvis.  Indistinct appearance of the pancreas with some question of pancreatitis.  Official radiology report(s): CT ABDOMEN PELVIS WO CONTRAST  Result Date: 04/16/2021 CLINICAL DATA:  Left upper quadrant pain EXAM: CT ABDOMEN AND PELVIS WITHOUT CONTRAST TECHNIQUE: Multidetector CT imaging of the abdomen and pelvis was performed following the standard protocol without IV contrast. COMPARISON:  Ultrasound 03/27/2021, MRI 03/27/2021 FINDINGS: Lower chest: No acute abnormality. Hepatobiliary: No focal hepatic abnormality. Status post cholecystectomy. Minimal fluid and soft tissue  thickening at the gallbladder fossa presumably resolving postsurgical change. Pancreas: Slightly indistinct appearance of pancreas with mild hazy infiltration of peripancreatic fat. No ductal dilatation Spleen: Normal in size without focal abnormality. Adrenals/Urinary Tract: Adrenal glands are unremarkable. Kidneys are normal, without renal calculi, focal lesion, or hydronephrosis. Bladder is unremarkable. Stomach/Bowel: Stomach is within normal limits. Appendix appears normal. No evidence of bowel wall thickening, distention, or inflammatory changes. Vascular/Lymphatic: No significant vascular findings are present. No enlarged abdominal or pelvic lymph nodes. Reproductive: Uterus and bilateral adnexa are unremarkable. Other: Small free fluid in the pelvis.  No free air Musculoskeletal: No acute or significant osseous findings. IMPRESSION: 1. Indistinct appearance of pancreas with suspected hazy infiltration  of peripancreatic fat suggestive of pancreatitis. Correlate with appropriate enzymes. 2. Status post cholecystectomy with mild soft tissue thickening and fluid at the gallbladder fossa likely resolving postsurgical change 3. Small free fluid in the pelvis Electronically Signed   By: Jasmine PangKim  Fujinaga M.D.   On: 04/16/2021 02:05    ____________________________________________   PROCEDURES   Procedure(s) performed (including Critical Care):  Procedures   ____________________________________________   INITIAL IMPRESSION / MDM / ASSESSMENT AND PLAN / ED COURSE  As part of my medical decision making, I reviewed the following data within the electronic MEDICAL RECORD NUMBER Nursing notes reviewed and incorporated, Labs reviewed , Old chart reviewed and Notes from prior ED visits   Differential diagnosis includes, but is not limited to, postoperative infection, persistent pancreatitis, new intra-abdominal infection such as diverticulitis.  Patient's vital signs are stable and within normal limits.  Lab work is reassuring including a normal comprehensive metabolic panel and a normal CBC; she has chronic anemia and her hemoglobin is 7.6 is consistent with her prior values.  There is no leukocytosis.  She reports that she has been eating and drinking okay.  Her urinalysis is unremarkable and her pregnancy test is negative.  Given that she is 2 weeks out and started to have some new pain, I will obtain a CT scan without IV contrast (given that we have a critical national shortage of IV contrast material).  I will reassess at that point.  Patient understands and agrees with the plan.       Clinical Course as of 04/16/21 0355  Wynelle LinkSun Apr 16, 2021  [id number] CT scan suggestive of pancreatitis but this may be similar to her prior known acute gallstone pancreatitis.  I added on a lipase which had not been ordered previously.  Patient remained stable.  I will talk with her about the results once we get the lipase  back. [CF]  0352 Lipase(!): 62 Patient's lipase is slightly elevated at 62 but it is downtrending since her last lipase on record.  The patient has been resting comfortably after 2 Percocet.  I updated her about the results and the possibility that this is some persistent pain from the pancreatitis but explained that there does not seem to be any new emergent condition at this time.  She is comfortable with the plan for prescriptions for pain medicine and nausea medicine and she already has an appoint with surgery and she believes 2 days.  I gave my usual and customary management recommendations and return precautions. [CF]    Clinical Course User Index [CF] Loleta RoseForbach, Alfrieda Tarry, MD     ____________________________________________  FINAL CLINICAL IMPRESSION(S) / ED DIAGNOSES  Final diagnoses:  Acute biliary pancreatitis, unspecified complication status     MEDICATIONS GIVEN DURING THIS VISIT:  Medications  oxyCODONE-acetaminophen (PERCOCET/ROXICET) 5-325  MG per tablet 2 tablet (2 tablets Oral Given 04/16/21 0241)     ED Discharge Orders         Ordered    oxyCODONE-acetaminophen (PERCOCET) 5-325 MG tablet  Every 6 hours PRN        04/16/21 0355    ondansetron (ZOFRAN ODT) 4 MG disintegrating tablet        04/16/21 0355    docusate sodium (COLACE) 100 MG capsule        04/16/21 0355          *Please note:  Katie Kidd was evaluated in Emergency Department on 04/16/2021 for the symptoms described in the history of present illness. She was evaluated in the context of the global COVID-19 pandemic, which necessitated consideration that the patient might be at risk for infection with the SARS-CoV-2 virus that causes COVID-19. Institutional protocols and algorithms that pertain to the evaluation of patients at risk for COVID-19 are in a state of rapid change based on information released by regulatory bodies including the CDC and federal and state organizations. These policies and  algorithms were followed during the patient's care in the ED.  Some ED evaluations and interventions may be delayed as a result of limited staffing during and after the pandemic.*  Note:  This document was prepared using Dragon voice recognition software and may include unintentional dictation errors.   Loleta Rose, MD 04/16/21 [phone]

## 2021-04-16 NOTE — Discharge Instructions (Signed)
As we discussed, your evaluation tonight was reassuring, and we believe you are still having some pain associated with your pancreatitis.  Please read through the included information, including the eating plan.  Please use over-the-counter ibuprofen and/or Tylenol as needed for pain. Take Percocet as prescribed for severe pain. Do not drink alcohol, drive or participate in any other potentially dangerous activities while taking this medication as it may make you sleepy. Do not take this medication with any other sedating medications, either prescription or over-the-counter. If you were prescribed Percocet or Vicodin, do not take these with acetaminophen (Tylenol) as it is already contained within these medications.   This medication is an opiate (or narcotic) pain medication and can be habit forming.  Use it as little as possible to achieve adequate pain control.  Do not use or use it with extreme caution if you have a history of opiate abuse or dependence.  If you are on a pain contract with your primary care doctor or a pain specialist, be sure to let them know you were prescribed this medication today from the [facility] Emergency Department.  This medication is intended for your use only - do not give any to anyone else and keep it in a secure place where nobody else, especially children, have access to it.  It will also cause or worsen constipation, so you may want to consider taking an over-the-counter stool softener while you are taking this medication.  Follow-up with your doctor/surgeon in 2 days as scheduled.  Return to the emergency department if you develop new or worsening symptoms that concern you.

## 2021-04-16 NOTE — ED Notes (Signed)
Pt ambulatory to POV without difficulty. VSS. NAD. All questions and concerns addressed.

## 2021-04-16 NOTE — ED Notes (Addendum)
Pt c/o LLQ abdominal pain since cholecystectomy on May 12. Pt sts pain is worse tonight but has been intermittent since surgery. Pt also notes 1-2 diarrhea episodes per day since surgery. Pt denies constipation or N/V. Surgical scars are well healing. Denies having dark stool, blood in stool, or vaginal bleeding since last admission.

## 2021-04-18 ENCOUNTER — Other Ambulatory Visit: Payer: Self-pay

## 2021-04-18 ENCOUNTER — Encounter: Payer: Self-pay | Admitting: Physician Assistant

## 2021-04-18 ENCOUNTER — Telehealth (INDEPENDENT_AMBULATORY_CARE_PROVIDER_SITE_OTHER): Payer: Medicaid Other | Admitting: Physician Assistant

## 2021-04-18 DIAGNOSIS — K851 Biliary acute pancreatitis without necrosis or infection: Secondary | ICD-10-CM

## 2021-04-18 DIAGNOSIS — Z09 Encounter for follow-up examination after completed treatment for conditions other than malignant neoplasm: Secondary | ICD-10-CM

## 2021-04-18 NOTE — Progress Notes (Signed)
[facility] SURGICAL OFFICE VISIT - TELEPHONE  No referring provider defined for this encounter.  Virtual Visit via Telemedicine Note I connected with Katie Kidd by telephone at her home on 04/18/21 at  2:15 PM EDT and verified that I was speaking with the correct person using their name and two idenfiers/date of birth.   I discussed the limitations, risks, security and privacy concerns of performing an evaluation and management service by telemedicine and the availability of in person appointments. I also discussed with the patient that there may be a patient responsible charge related to this service. The patient expressed understanding and agreed to proceed.  History of Present Illness: Katie Kidd is a 30 y.o. female who is 19 days s/p laparoscopic cholecystectomy for gall stone pancreatitis and cholecystitis.   She reports that she has continued to have similar abdominal discomfort since discharging home. She is not able to specify locations and states it varies day to day. She did present to the ED on 05/29 for this. Work up was concerning for possible mild pancreatitis but lipase was 62. She felt better after pain medications and was ultimately discharged home. No reports of fever, chills, nausea, emesis. She has been able to tolerate PO without issue. No issues with incisions.    Assessment/Plan:  @NAME  is a 30 y.o. female who is 19 days s/p laparoscopic cholecystectomy for gall stone pancreatitis and cholecystitis.    - Given her continues complaints of pain, I do not think a phone visit will be sufficient in addressing needs and providing adequate examination. Patient has returned to [facility] and is willing to come in person. She understands the office will contact her to schedule in person visit on 06/06.    From [facility] outpatient surgery office, I provided 5 minutes of non-face-to-face time during this encounter.  -- 08/06, PA-C [facility] 04/18/2021, 2:00 PM [phone] M-F: 7am - 4pm

## 2021-04-24 ENCOUNTER — Encounter: Payer: Self-pay | Admitting: Physician Assistant

## 2021-04-24 ENCOUNTER — Ambulatory Visit (INDEPENDENT_AMBULATORY_CARE_PROVIDER_SITE_OTHER): Payer: Medicaid Other | Admitting: Physician Assistant

## 2021-04-24 ENCOUNTER — Other Ambulatory Visit: Payer: Self-pay

## 2021-04-24 VITALS — BP 125/78 | HR 105 | Temp 98.9°F | Ht 63.0 in | Wt 114.4 lb

## 2021-04-24 DIAGNOSIS — Z09 Encounter for follow-up examination after completed treatment for conditions other than malignant neoplasm: Secondary | ICD-10-CM

## 2021-04-24 DIAGNOSIS — K851 Biliary acute pancreatitis without necrosis or infection: Secondary | ICD-10-CM

## 2021-04-24 MED ORDER — CYCLOBENZAPRINE HCL 5 MG PO TABS: 5.0000 mg | ORAL_TABLET | Freq: Every day | ORAL | 0 refills | Status: DC

## 2021-04-24 NOTE — Patient Instructions (Addendum)
Miralax can help with constipation and Imodium can help with diarrhea. You may get both over the counter.    If you have any concerns or questions, please feel free to call our office.    GENERAL POST-OPERATIVE PATIENT INSTRUCTIONS   WOUND CARE INSTRUCTIONS:  Keep a dry clean dressing on the wound if there is drainage. The initial bandage may be removed after 24 hours.  Once the wound has quit draining you may leave it open to air.  If clothing rubs against the wound or causes irritation and the wound is not draining you may cover it with a dry dressing during the daytime.  Try to keep the wound dry and avoid ointments on the wound unless directed to do so.  If the wound becomes bright red and painful or starts to drain infected material that is not clear, please contact your physician immediately.  If the wound is mildly pink and has a thick firm ridge underneath it, this is normal, and is referred to as a healing ridge.  This will resolve over the next 4-6 weeks.  BATHING: You may shower if you have been informed of this by your surgeon. However, Please do not submerge in a tub, hot tub, or pool until incisions are completely sealed or have been told by your surgeon that you may do so.  DIET:  You may eat any foods that you can tolerate.  It is a good idea to eat a high fiber diet and take in plenty of fluids to prevent constipation.  If you do become constipated you may want to take a mild laxative or take ducolax tablets on a daily basis until your bowel habits are regular.  Constipation can be very uncomfortable, along with straining, after recent surgery.  ACTIVITY:  You are encouraged to cough and deep breath or use your incentive spirometer if you were given one, every 15-30 minutes when awake.  This will help prevent respiratory complications and low grade fevers post-operatively if you had a general anesthetic.  You may want to hug a pillow when coughing and sneezing to add additional  support to the surgical area, if you had abdominal or chest surgery, which will decrease pain during these times.  You are encouraged to walk and engage in light activity for the next two weeks.  You should not lift more than 20 pounds, until 04/27/2021 as it could put you at increased risk for complications.  Twenty pounds is roughly equivalent to a plastic bag of groceries. At that time- Listen to your body when lifting, if you have pain when lifting, stop and then try again in a few days. Soreness after doing exercises or activities of daily living is normal as you get back in to your normal routine.  MEDICATIONS:  Try to take narcotic medications and anti-inflammatory medications, such as tylenol, ibuprofen, naprosyn, etc., with food.  This will minimize stomach upset from the medication.  Should you develop nausea and vomiting from the pain medication, or develop a rash, please discontinue the medication and contact your physician.  You should not drive, make important decisions, or operate machinery when taking narcotic pain medication.  SUNBLOCK Use sun block to incision area over the next year if this area will be exposed to sun. This helps decrease scarring and will allow you avoid a permanent darkened area over your incision.     Gallbladder Eating Plan If you have a gallbladder condition, you may have trouble digesting fats. Eating a  low-fat diet can help reduce your symptoms, and may be helpful before and after having surgery to remove your gallbladder (cholecystectomy). Your health care provider may recommend that you work with a diet and nutrition specialist (dietitian) to help you reduce the amount of fat in your diet. What are tips for following this plan? General guidelines  Limit your fat intake to less than 30% of your total daily calories. If you eat around 1,800 calories each day, this is less than 60 grams (g) of fat per day.  Fat is an important part of a healthy diet. Eating  a low-fat diet can make it hard to maintain a healthy body weight. Ask your dietitian how much fat, calories, and other nutrients you need each day.  Eat small, frequent meals throughout the day instead of three large meals.  Drink at least 8-10 cups of fluid a day. Drink enough fluid to keep your urine clear or pale yellow.  Limit alcohol intake to no more than 1 drink a day for nonpregnant women and 2 drinks a day for men. One drink equals 12 oz of beer, 5 oz of wine, or 1 oz of hard liquor. Reading food labels  Check Nutrition Facts on food labels for the amount of fat per serving. Choose foods with less than 3 grams of fat per serving.   Shopping  Choose nonfat and low-fat healthy foods. Look for the words "nonfat," "low fat," or "fat free."  Avoid buying processed or prepackaged foods. Cooking  Cook using low-fat methods, such as baking, broiling, grilling, or boiling.  Cook with small amounts of healthy fats, such as olive oil, grapeseed oil, canola oil, or sunflower oil. What foods are recommended?  All fresh, frozen, or canned fruits and vegetables.  Whole grains.  Low-fat or non-fat (skim) milk and yogurt.  Lean meat, skinless poultry, fish, eggs, and beans.  Low-fat protein supplement powders or drinks.  Spices and herbs. What foods are not recommended?  High-fat foods. These include baked goods, fast food, fatty cuts of meat, ice cream, french toast, sweet rolls, pizza, cheese bread, foods covered with butter, creamy sauces, or cheese.  Fried foods. These include french fries, tempura, battered fish, breaded chicken, fried breads, and sweets.  Foods with strong odors.  Foods that cause bloating and gas. Summary  A low-fat diet can be helpful if you have a gallbladder condition, or before and after gallbladder surgery.  Limit your fat intake to less than 30% of your total daily calories. This is about 60 g of fat if you eat 1,800 calories each day.  Eat  small, frequent meals throughout the day instead of three large meals. This information is not intended to replace advice given to you by your health care provider. Make sure you discuss any questions you have with your health care provider. Document Revised: 06/23/2020 Document Reviewed: 06/23/2020 Elsevier Patient Education  2021 Elsevier Inc.    Ibuprofen Oral Tablets and Capsules What is this medicine? IBUPROFEN (eye BYOO proe fen) is a non-steroidal anti-inflammatory drug, also known as an NSAID. It treats pain, inflammation, and swelling. It also reduces fever and minor aches and pains caused by the cold, flu, or a sore throat. This medicine may be used for other purposes; ask your health care provider or pharmacist if you have questions. COMMON BRAND NAME(S): Advil, Advil Junior Strength, Advil Migraine, Genpril, Ibren, IBU, Ibupak, Midol, Midol Cramps and Body Aches, Motrin, Motrin IB, Motrin Junior Strength, Motrin Migraine Pain, Samson-8, Toxicology  Saliva Collection What should I tell my health care provider before I take this medicine? They need to know if you have any of these conditions:  bleeding disorder  coronary artery bypass graft (CABG) within the past 2 weeks  dehydration  diarrhea  heart attack  heart disease  heart failure  high blood pressure  if you often drink alcohol  kidney disease  liver disease  lung or breathing disease (asthma)  receiving steroids like dexamethasone or prednisone  smoke tobacco cigarettes  stomach bleeding  stomach ulcers, other stomach or intestine problems  stroke  take drugs that treat or prevent blood clots  vomiting  an unusual or allergic reaction to ibuprofen, other medicines, foods, dyes, or preservatives  pregnant or trying to get pregnant  breast-feeding How should I use this medicine? Take this drug by mouth. Take it as directed on the label. You can take it with or without food. If it upsets your  stomach, take it with food. Talk to your health care provider about the use of this drug in children. While it may be prescribed for children as young as 12 for selected conditions, precautions do apply. Patients over 63 years of age may have a stronger reaction and need a smaller dose. If you get this drug as a prescription, a special MedGuide will be given to you by the pharmacist with each prescription and refill. Be sure to read this information carefully each time. Overdosage: If you think you have taken too much of this medicine contact a poison control center or emergency room at once. NOTE: This medicine is only for you. Do not share this medicine with others. What if I miss a dose? If you take this drug on a regular basis, take it as soon as you can. If it is almost time for your next dose, take only that dose. Do not take double or extra doses. What may interact with this medicine? Do not take this medicine with any of the following medications:  cidofovir  ketorolac  methotrexate  pemetrexed This medicine may also interact with the following medications:  alcohol  aspirin  diuretics  lithium  other drugs for inflammation like prednisone  warfarin This list may not describe all possible interactions. Give your health care provider a list of all the medicines, herbs, non-prescription drugs, or dietary supplements you use. Also tell them if you smoke, drink alcohol, or use illegal drugs. Some items may interact with your medicine. What should I watch for while using this medicine? Visit your health care provider for regular checks on your progress. Tell your health care provider if your symptoms do not start to get better or if they get worse. A painful sore throat or sore throat with high fevers, headaches, nausea, or vomiting may be signs of a serious infection. Call your health care provider if these symptoms occur. Do not use this medicine for more than 2 days or give to  children under 1 years of age unless your health care provider tells you to. Do not take other medicines that contain aspirin, ibuprofen, or naproxen with this medicine. Side effects such as stomach upset, nausea, or ulcers may be more likely to occur. Many non-prescription medicines contain aspirin, ibuprofen, or naproxen. Always read labels carefully. This medicine can cause serious ulcers and bleeding in the stomach. It can happen with no warning. Smoking, drinking alcohol, older age, and poor health can also increase risks. Call your health care provider right away if you  have stomach pain or blood in your vomit or stool. This medicine does not prevent a heart attack or stroke. This medicine may increase the chance of a heart attack or stroke. The chance may increase the longer you use this medicine or if you have heart disease. If you take aspirin to prevent a heart attack or stroke, talk to your health care provider about using this medicine. Alcohol may interfere with the effect of this medicine. Avoid alcoholic drinks. This medicine may cause serious skin reactions. They can happen weeks to months after starting the medicine. Contact your health care provider right away if you notice fevers or flu-like symptoms with a rash. The rash may be red or purple and then turn into blisters or peeling of the skin. Or, you might notice a red rash with swelling of the face, lips or lymph nodes in your neck or under your arms. Talk to your health care provider if you are pregnant before taking this medicine. Taking this medicine between weeks 20 and 30 of pregnancy may harm your unborn baby. Your health care provider will monitor you closely if you need to take it. After 30 weeks of pregnancy, do not take this medicine. You may get drowsy or dizzy. Do not drive, use machinery, or do anything that needs mental alertness until you know how this medicine affects you. Do not stand up or sit up quickly, especially if  you are an older patient. This reduces the risk of dizzy or fainting spells. Be careful brushing or flossing your teeth or using a toothpick because you may get an infection or bleed more easily. If you have any dental work done, tell your dentist you are receiving this medicine. This medicine may make it more difficult to get pregnant. Talk to your health care provider if you are concerned about your fertility. What side effects may I notice from receiving this medicine? Side effects that you should report to your doctor or health care provider as soon as possible:  allergic reactions (skin rash, itching or hives; swelling of the face, lips, or tongue)  aseptic meningitis (stiff neck; sensitivity to light; headache; drowsiness; fever; nausea, vomiting; rash)  bleeding (bloody or black, tarry stools; red or dark brown urine; spitting up blood or brown material that looks like coffee grounds; red spots on the skin; unusual bruising or bleeding from the eyes, gums, or nose)  blurred vision OR changes in vision  heart attack (trouble breathing; pain or tightness in the chest, neck, back or arms; unusually weak or tired)  heart failure (trouble breathing; fast, irregular heartbeat; sudden weight gain; swelling of the ankles, feet, hands; unusually weak or tired)  high potassium levels (chest pain; fast, irregular heartbeat; muscle weakness)  increase in blood pressure  infection (fever, chills, cough, sore throat, pain or trouble passing urine)  kidney injury (trouble passing urine or change in the amount of urine)  liver injury (dark yellow or brown urine; general ill feeling or flu-like symptoms; loss of appetite, right upper belly pain; unusually weak or tired, yellowing of the eyes or skin)  low blood pressure (dizziness; feeling faint or lightheaded, falls; unusually weak or tired)  low red blood cell counts (trouble breathing; feeling faint; lightheaded, falls; unusually weak or  tired)  rash, fever, and swollen lymph nodes  redness, blistering, peeling, or loosening of the skin, including inside the mouth  stroke (changes in vision; confusion; trouble speaking or understanding; severe headaches; sudden numbness or weakness of the  face, arm or leg; trouble walking; dizziness; loss of balance or coordination) Side effects that usually do not require medical attention (report to your doctor or health care provider if they continue or are bothersome):  cough  constipation  diarrhea  dizziness  headache  upset stomach  vomiting This list may not describe all possible side effects. Call your doctor for medical advice about side effects. You may report side effects to FDA at 1-800-FDA-1088. Where should I keep my medicine? Keep out of the reach of children and pets. Store at room temperature between 20 and 25 degrees C (68 and 77 degrees F). Get rid of any unused medicine after the expiration date. To get rid of medicines that are no longer needed or have expired:  Take the medicine to a medicine take-back program. Check with your pharmacy or law enforcement to find a location.  If you cannot return the medicine, check the label or package insert to see if the medicine should be thrown out in the garbage or flushed down the toilet. If you are not sure, ask your health care provider. If it is safe to put it in the trash, empty the medicine out of the container. Mix the medicine with cat litter, dirt, coffee grounds, or other unwanted substance. Seal the mixture in a bag or container. Put it in the trash. NOTE: This sheet is a summary. It may not cover all possible information. If you have questions about this medicine, talk to your doctor, pharmacist, or health care provider.  2021 Elsevier/Gold Standard (2020-04-01 12:06:36)   Acetaminophen tablets or caplets What is this medicine? ACETAMINOPHEN (a set a MEE noe fen) is a pain reliever. It is used to treat  mild pain and fever. This medicine may be used for other purposes; ask your health care provider or pharmacist if you have questions. COMMON BRAND NAME(S): Aceta, Actamin, Anacin Aspirin Free, Genapap, Genebs, Mapap, Pain & Fever, Pain and Fever, PAIN RELIEF, PAIN RELIEF Extra Strength, Pain Reliever, Panadol, PHARBETOL, Plus PHARMA, Q-Pap, Q-Pap Extra Strength, Tylenol, Tylenol CrushableTablet, Tylenol Extra Strength, Tylenol Regular Strength, XS No Aspirin, XS Pain Reliever What should I tell my health care provider before I take this medicine? They need to know if you have any of these conditions:  if you often drink alcohol  liver disease  an unusual or allergic reaction to acetaminophen, other medicines, foods, dyes, or preservatives  pregnant or trying to get pregnant  breast-feeding How should I use this medicine? Take this medicine by mouth with a glass of water. Follow the directions on the package or prescription label. Take your medicine at regular intervals. Do not take your medicine more often than directed. Talk to your pediatrician regarding the use of this medicine in children. While this drug may be prescribed for children as young as 57 years of age for selected conditions, precautions do apply. Overdosage: If you think you have taken too much of this medicine contact a poison control center or emergency room at once. NOTE: This medicine is only for you. Do not share this medicine with others. What if I miss a dose? If you miss a dose, take it as soon as you can. If it is almost time for your next dose, take only that dose. Do not take double or extra doses. What may interact with this medicine?  alcohol  imatinib  isoniazid  other medicines with acetaminophen This list may not describe all possible interactions. Give your health care provider a  list of all the medicines, herbs, non-prescription drugs, or dietary supplements you use. Also tell them if you smoke, drink  alcohol, or use illegal drugs. Some items may interact with your medicine. What should I watch for while using this medicine? Tell your doctor or health care professional if the pain lasts more than 10 days (5 days for children), if it gets worse, or if there is a new or different kind of pain. Also, check with your doctor if a fever lasts for more than 3 days. Do not take other medicines that contain acetaminophen with this medicine. Always read labels carefully. If you have questions, ask your doctor or pharmacist. If you take too much acetaminophen get medical help right away. Too much acetaminophen can be very dangerous and cause liver damage. Even if you do not have symptoms, it is important to get help right away. What side effects may I notice from receiving this medicine? Side effects that you should report to your doctor or health care professional as soon as possible:  allergic reactions like skin rash, itching or hives, swelling of the face, lips, or tongue  breathing problems  fever or sore throat  redness, blistering, peeling or loosening of the skin, including inside the mouth  trouble passing urine or change in the amount of urine  unusual bleeding or bruising  unusually weak or tired  yellowing of the eyes or skin Side effects that usually do not require medical attention (report to your doctor or health care professional if they continue or are bothersome):  headache  nausea, stomach upset This list may not describe all possible side effects. Call your doctor for medical advice about side effects. You may report side effects to FDA at 1-800-FDA-1088. Where should I keep my medicine? Keep out of reach of children. Store at room temperature between 20 and 25 degrees C (68 and 77 degrees F). Protect from moisture and heat. Throw away any unused medicine after the expiration date. NOTE: This sheet is a summary. It may not cover all possible information. If you have  questions about this medicine, talk to your doctor, pharmacist, or health care provider.  2021 Elsevier/Gold Standard (2013-06-29 12:54:16)

## 2021-04-24 NOTE — Progress Notes (Signed)
[facility] POST-OP OFFICE VISIT  04/24/2021  HPI: Katie Kidd is a 30 y.o. female 25 days s/p laparoscopic cholecystectomy for gallstone pancreatitis and cholecystitis with Dr Lady Gary   Since our telemedicine visit, she reports that she feels about the same, but may have made some slight improvements She mostly reports soreness in her upper/left abdomen, seems to be worse after moving around all day Additionally, having a decreased appetite and waxing and waning constipation/diarrhea No fever, chills, nausea, emesis Incisions have healed well  Vital signs: BP 125/78   Pulse (!) 105   Temp 98.9 F (37.2 C) (Oral)   Ht 5\' 3"  (1.6 m)   Wt 114 lb 6.4 oz (51.9 kg)   LMP 04/12/2021   SpO2 98%   BMI 20.27 kg/m    Physical Exam: Constitutional: Well appearing female, NAD Abdomen: Soft, mild left sided soreness, non-distended, no rebound/guarding  Skin: Laparoscopic incisions are well healed, no erythema or drainage   Assessment/Plan: This is a 30 y.o. female 25 days s/p laparoscopic cholecystectomy for gallstone pancreatitis and cholecystitis   - I suspect some of her symptoms maybe attributable to residual pancreatitis seen on CT from the ED on 05/29  - Encouraged adequate hydration, and supplementing meals with protein shakes if she continues to have decreased appetite   - Reviewed dosing for OTC pain medications (Tylenol/Motrin); I will try QHS flexeril as she seems to be describing muscle soreness/spasm at the end of the day  - Reviewed lifting restrictions   - Reviewed surgical pathology: [facility]  - She can follow up on as needed basis for now; she understands to call with questions or concerns   -- [facility], PA-C [facility] 04/24/2021, 11:30 AM [phone] M-F: 7am - 4pm

## 2021-08-02 ENCOUNTER — Encounter: Payer: Self-pay | Admitting: Emergency Medicine

## 2021-08-02 ENCOUNTER — Emergency Department
Admission: EM | Admit: 2021-08-02 | Discharge: 2021-08-02 | Disposition: A | Discharge status: Home / Self Care | Payer: Medicaid Other | Attending: Emergency Medicine | Admitting: Emergency Medicine

## 2021-08-02 ENCOUNTER — Other Ambulatory Visit: Payer: Self-pay

## 2021-08-02 ENCOUNTER — Emergency Department: Payer: Medicaid Other

## 2021-08-02 DIAGNOSIS — R110 Nausea: Secondary | ICD-10-CM | POA: Diagnosis not present

## 2021-08-02 DIAGNOSIS — R079 Chest pain, unspecified: Secondary | ICD-10-CM | POA: Insufficient documentation

## 2021-08-02 DIAGNOSIS — R0789 Other chest pain: Secondary | ICD-10-CM

## 2021-08-02 DIAGNOSIS — R519 Headache, unspecified: Secondary | ICD-10-CM

## 2021-08-02 DIAGNOSIS — J45909 Unspecified asthma, uncomplicated: Secondary | ICD-10-CM | POA: Diagnosis not present

## 2021-08-02 LAB — CBC
HCT: 32 % — ABNORMAL LOW (ref 36.0–46.0)
Hemoglobin: 9 g/dL — ABNORMAL LOW (ref 12.0–15.0)
MCH: 18 pg — ABNORMAL LOW (ref 26.0–34.0)
MCHC: 28.1 g/dL — ABNORMAL LOW (ref 30.0–36.0)
MCV: 64 fL — ABNORMAL LOW (ref 80.0–100.0)
Platelets: 321 10*3/uL (ref 150–400)
RBC: 5 MIL/uL (ref 3.87–5.11)
RDW: 18.9 % — ABNORMAL HIGH (ref 11.5–15.5)
WBC: 3.8 10*3/uL — ABNORMAL LOW (ref 4.0–10.5)
nRBC: 0 % (ref 0.0–0.2)

## 2021-08-02 LAB — BASIC METABOLIC PANEL
Anion gap: 11 (ref 5–15)
BUN: 11 mg/dL (ref 6–20)
CO2: 22 mmol/L (ref 22–32)
Calcium: 9.1 mg/dL (ref 8.9–10.3)
Chloride: 103 mmol/L (ref 98–111)
Creatinine, Ser: 0.88 mg/dL (ref 0.44–1.00)
GFR, Estimated: >60 mL/min (ref >60)
Glucose, Bld: 96 mg/dL (ref 70–99)
Potassium: 3.7 mmol/L (ref 3.5–5.1)
Sodium: 136 mmol/L (ref 135–145)

## 2021-08-02 LAB — TROPONIN I (HIGH SENSITIVITY): Troponin I (High Sensitivity): <2 ng/L (ref <18)

## 2021-08-02 MED ORDER — ONDANSETRON 4 MG PO TBDP: 4.0000 mg | ORAL_TABLET | Freq: Three times a day (TID) | ORAL | 0 refills | Status: DC | PRN

## 2021-08-02 NOTE — ED Notes (Signed)
Pt signed physical discharge form and ambulated self to lobby.

## 2021-08-02 NOTE — ED Notes (Signed)
LG and Lav tubes send to lab

## 2021-08-02 NOTE — ED Triage Notes (Signed)
Pt comes into the ED via POV c/o chest pain that is central, nausea, and headache.  Pt states symptoms began 3 days ago.  Pt has even and unlabored respirations at this time and is ambulatory to triage.

## 2021-08-02 NOTE — ED Provider Notes (Signed)
[facility] Emergency Department Provider Note   ____________________________________________   Event Date/Time   First MD Initiated Contact with Patient 08/02/21 [phone]     (approximate)  I have reviewed the triage vital signs and the nursing notes.   HISTORY  Chief Complaint Chest Pain, Nausea, and Headache    HPI Katie Kidd is a 30 y.o. female who presents for chest tightness  LOCATION: Central chest DURATION: 3 days prior to arrival TIMING: Slightly worsened since onset SEVERITY: Moderate QUALITY: Tightness CONTEXT: Patient states that she began having a headache as well as central chest tightness and nausea that began 3 days prior to arrival and has been worsening since onset MODIFYING FACTORS: Denies any modifying factors ASSOCIATED SYMPTOMS: Nausea, headache   Per medical record review, patient has history of asthma          Past Medical History:  Diagnosis Date   Asthma     Patient Active Problem List   Diagnosis Date Noted   Acute gallstone pancreatitis 03/29/2021   Gallstone pancreatitis 03/27/2021    History reviewed. No pertinent surgical history.  Prior to Admission medications   Medication Sig Start Date End Date Taking? Authorizing Provider  ondansetron (ZOFRAN ODT) 4 MG disintegrating tablet Take 1 tablet (4 mg total) by mouth every 8 (eight) hours as needed for nausea or vomiting. 08/02/21  Yes Merwyn Katos, MD  acetaminophen (TYLENOL) 500 MG tablet Take 500 mg by mouth every 6 (six) hours as needed for moderate pain, mild pain, fever or headache.    [provider]  cyclobenzaprine (FLEXERIL) 5 MG tablet Take 1 tablet (5 mg total) by mouth at bedtime. 04/24/21   Donovan Kail, PA-C  docusate sodium (COLACE) 100 MG capsule Take 1 tablet once or twice daily as needed for constipation while taking narcotic pain medicine 04/16/21   Loleta Rose, MD  ibuprofen (ADVIL) 600 MG tablet Take 1 tablet (600  mg total) by mouth every 6 (six) hours as needed. 03/31/21   Donovan Kail, PA-C    Allergies Patient has no known allergies.  History reviewed. No pertinent family history.  Social History Social History   Tobacco Use   Smoking status: Never   Smokeless tobacco: Never  Vaping Use   Vaping Use: Some days  Substance Use Topics   Alcohol use: Not Currently    Comment: social   Drug use: Never    Review of Systems Constitutional: No fever/chills Eyes: No visual changes. ENT: No sore throat. Cardiovascular: Endorses chest pain. Respiratory: Denies shortness of breath. Gastrointestinal: No abdominal pain.  Endorses nausea, no vomiting.  No diarrhea. Genitourinary: Negative for dysuria. Musculoskeletal: Negative for acute arthralgias Skin: Negative for rash. Neurological: Positive for headaches, negative for weakness/numbness/paresthesias in any extremity Psychiatric: Negative for suicidal ideation/homicidal ideation   ____________________________________________   PHYSICAL EXAM:  VITAL SIGNS: ED Triage Vitals  Enc Vitals Group     BP 08/02/21 0816 116/73     Pulse Rate 08/02/21 0816 (!) 115     Resp 08/02/21 0816 18     Temp 08/02/21 0816 98.4 F (36.9 C)     Temp src --      SpO2 08/02/21 0816 100 %     Weight 08/02/21 0814 114 lb 6.7 oz (51.9 kg)     Height 08/02/21 0814 5\' 3"  (1.6 m)     Head Circumference --      Peak Flow --      Pain Score 08/02/21  0813 10     Pain Loc --      Pain Edu? --      Excl. in GC? --    Constitutional: Alert and oriented. Well appearing and in no acute distress. Eyes: Conjunctivae are normal. PERRL. Head: Atraumatic. Nose: No congestion/rhinnorhea. Mouth/Throat: Mucous membranes are moist. Neck: No stridor Cardiovascular: Grossly normal heart sounds.  Good peripheral circulation. Respiratory: Normal respiratory effort.  No retractions. Gastrointestinal: Soft and nontender. No distention. Musculoskeletal: No obvious  deformities Neurologic:  Normal speech and language. No gross focal neurologic deficits are appreciated. Skin:  Skin is warm and dry. No rash noted. Psychiatric: Mood and affect are normal. Speech and behavior are normal.  ____________________________________________   LABS (all labs ordered are listed, but only abnormal results are displayed)  Labs Reviewed  CBC - Abnormal; Notable for the following components:      Result Value   WBC 3.8 (*)    Hemoglobin 9.0 (*)    HCT 32.0 (*)    MCV 64.0 (*)    MCH 18.0 (*)    MCHC 28.1 (*)    RDW 18.9 (*)    All other components within normal limits  BASIC METABOLIC PANEL  POC URINE PREG, ED  TROPONIN I (HIGH SENSITIVITY)   ____________________________________________  EKG  ED ECG REPORT I, Merwyn Katos, the attending physician, personally viewed and interpreted this ECG.  Date: 08/02/2021 EKG Time: 0817 Rate: 120 Rhythm: Tachycardic sinus rhythm QRS Axis: normal Intervals: normal ST/T Wave abnormalities: normal Narrative Interpretation: Tachycardic sinus rhythm.  No evidence of acute ischemia  ____________________________________________  RADIOLOGY  ED MD interpretation: 2 view chest x-ray shows no evidence of acute abnormalities including no pneumonia, pneumothorax, or widened mediastinum  Official radiology report(s): DG Chest 2 View  Result Date: 08/02/2021 CLINICAL DATA:  Chest pain. EXAM: CHEST - 2 VIEW COMPARISON:  Chest radiograph, 03/27/2021.  CT chest, 03/26/2021. FINDINGS: Cardiomediastinal silhouette is within normal limits. Lungs are well inflated. No focal consolidation. No pleural effusion or. No acute osseous abnormality. IMPRESSION: Normal chest. Electronically Signed   By: Roanna Banning M.D.   On: 08/02/2021 08:41    ____________________________________________   PROCEDURES  Procedure(s) performed (including Critical Care):  Procedures   ____________________________________________   INITIAL  IMPRESSION / ASSESSMENT AND PLAN / ED COURSE  As part of my medical decision making, I reviewed the following data within the electronic medical record, if available:  Nursing notes reviewed and incorporated, Labs reviewed, EKG interpreted, Old chart reviewed, Radiograph reviewed and Notes from prior ED visits reviewed and incorporated      Workup: ECG, CXR, CBC, BMP, Troponin Findings: ECG: No overt evidence of STEMI. No evidence of Brugadas sign, delta wave, epsilon wave, significantly prolonged QTc, or malignant arrhythmia HS Troponin: Negative x1 Other Labs unremarkable for emergent problems. CXR: Without PTX, PNA, or widened mediastinum Last Stress Test: Never Last Heart Catheterization: Never HEART Score: 1  Given History, Exam, and Workup I have low suspicion for ACS, Pneumothorax, Pneumonia, Pulmonary Embolus, Tamponade, Aortic Dissection or other emergent problem as a cause for this presentation.   Reassesment: Prior to discharge patients pain was controlled and they were well appearing.  Disposition:  Discharge. Strict return precautions discussed with patient with full understanding. Advised patient to follow up promptly with primary care provider       ____________________________________________   FINAL CLINICAL IMPRESSION(S) / ED DIAGNOSES  Final diagnoses:  Chest tightness  Acute nonintractable headache, unspecified headache type  Nausea  ED Discharge Orders          Ordered    ondansetron (ZOFRAN ODT) 4 MG disintegrating tablet  Every 8 hours PRN        08/02/21 9528             Note:  This document was prepared using Dragon voice recognition software and may include unintentional dictation errors.    Merwyn Katos, MD 08/02/21 [phone]

## 2021-08-08 ENCOUNTER — Encounter: Payer: Self-pay | Admitting: General Surgery

## 2021-08-16 ENCOUNTER — Other Ambulatory Visit: Payer: Self-pay

## 2021-08-16 ENCOUNTER — Ambulatory Visit
Admission: EM | Admit: 2021-08-16 | Discharge: 2021-08-16 | Disposition: A | Discharge status: Home / Self Care | Payer: Medicaid Other | Attending: Emergency Medicine | Admitting: Emergency Medicine

## 2021-08-16 ENCOUNTER — Ambulatory Visit (HOSPITAL_BASED_OUTPATIENT_CLINIC_OR_DEPARTMENT_OTHER)
Admit: 2021-08-16 | Discharge: 2021-08-16 | Disposition: A | Discharge status: Home / Self Care | Payer: Medicaid Other | Attending: Emergency Medicine | Admitting: Emergency Medicine

## 2021-08-16 DIAGNOSIS — M7989 Other specified soft tissue disorders: Secondary | ICD-10-CM | POA: Insufficient documentation

## 2021-08-16 DIAGNOSIS — M79605 Pain in left leg: Secondary | ICD-10-CM | POA: Diagnosis not present

## 2021-08-16 DIAGNOSIS — M79662 Pain in left lower leg: Secondary | ICD-10-CM | POA: Insufficient documentation

## 2021-08-16 MED ORDER — IBUPROFEN 800 MG PO TABS: 800.0000 mg | ORAL_TABLET | Freq: Three times a day (TID) | ORAL | 0 refills | Status: DC

## 2021-08-16 NOTE — Discharge Instructions (Signed)
Please go for ultrasound at the main [facility]-go to entrance C Use anti-inflammatories for pain/swelling. You may take up to 800 mg Ibuprofen every 8 hours with food. You may supplement Ibuprofen with Tylenol [phone] mg every 8 hours.  Warm compresses to back of leg I will call with results of ultrasound

## 2021-08-16 NOTE — ED Triage Notes (Signed)
Pt reports having lump to left knee and having bruising to the area. Pt states when pressure is applied there is a shooting pain up the leg. Pt stated she noticed the lump to her knee yesterday.

## 2021-08-16 NOTE — Progress Notes (Signed)
Left lower extremity venous duplex has been completed. Preliminary results can be found in CV Proc through chart review.  Results were given to [facility] PA.  08/16/21 2:14 PM Olen Cordial RVT

## 2021-08-17 ENCOUNTER — Telehealth: Payer: Self-pay | Admitting: Emergency Medicine

## 2021-08-17 NOTE — Telephone Encounter (Signed)
Patient called stating her symptoms were worsening.  C/o pain now radiating up to groin, worsening pain on outside of knee, and increased swelling.  Reviewed with [facility], APP  who encouraged patient to follow-up in ER, as DVT was negative and pain is more significant than it should be with just a sprain.  Patient verbalized understanding

## 2021-08-17 NOTE — ED Provider Notes (Signed)
[facility]    CSN: [id number] Arrival date & time: 08/16/21  1207      History   Chief Complaint Chief Complaint  Patient presents with   Leg Pain    HPI Attallah Mccormick is a 30 y.o. female presenting today for evaluation of left knee/leg pain.  Reports that over the past 24 hours she has developed increased pain discomfort swelling and areas of bruising.  Symptoms most prominent to the medial aspect of her knee and posterior aspect, radiating into thigh as well as into calf.  She denies any injury or trauma or increase in activity.  She denies history of similar.  She has had prior PE.  HPI  Past Medical History:  Diagnosis Date   Asthma     Patient Active Problem List   Diagnosis Date Noted   Acute gallstone pancreatitis 03/29/2021   Gallstone pancreatitis 03/27/2021    History reviewed. No pertinent surgical history.  OB History   No obstetric history on file.      Home Medications    Prior to Admission medications   Medication Sig Start Date End Date Taking? Authorizing Provider  ibuprofen (ADVIL) 800 MG tablet Take 1 tablet (800 mg total) by mouth 3 (three) times daily. 08/16/21  Yes Aanyah Loa C, PA-C  acetaminophen (TYLENOL) 500 MG tablet Take 500 mg by mouth every 6 (six) hours as needed for moderate pain, mild pain, fever or headache.    [provider]  ondansetron (ZOFRAN ODT) 4 MG disintegrating tablet Take 1 tablet (4 mg total) by mouth every 8 (eight) hours as needed for nausea or vomiting. 08/02/21   Merwyn Katos, MD    Family History History reviewed. No pertinent family history.  Social History Social History   Tobacco Use   Smoking status: Never   Smokeless tobacco: Never  Vaping Use   Vaping Use: Some days  Substance Use Topics   Alcohol use: Not Currently    Comment: social   Drug use: Never     Allergies   Patient has no known allergies.   Review of Systems Review of Systems  Constitutional:   Negative for fatigue and fever.  Eyes:  Negative for visual disturbance.  Respiratory:  Negative for shortness of breath.   Cardiovascular:  Negative for chest pain.  Gastrointestinal:  Negative for abdominal pain, nausea and vomiting.  Musculoskeletal:  Positive for arthralgias, gait problem and myalgias. Negative for joint swelling.  Skin:  Positive for color change. Negative for rash and wound.  Neurological:  Negative for dizziness, weakness, light-headedness and headaches.    Physical Exam Triage Vital Signs ED Triage Vitals  Enc Vitals Group     BP 08/16/21 1230 113/73     Pulse Rate 08/16/21 1230 96     Resp 08/16/21 1230 18     Temp 08/16/21 1230 98.8 F (37.1 C)     Temp Source 08/16/21 1230 Oral     SpO2 08/16/21 1230 98 %     Weight --      Height --      Head Circumference --      Peak Flow --      Pain Score 08/16/21 1228 10     Pain Loc --      Pain Edu? --      Excl. in GC? --    No data found.  Updated Vital Signs BP 113/73 (BP Location: Right Arm)   Pulse 96   Temp 98.8  F (37.1 C) (Oral)   Resp 18   LMP 08/02/2021   SpO2 98%   Visual Acuity Right Eye Distance:   Left Eye Distance:   Bilateral Distance:    Right Eye Near:   Left Eye Near:    Bilateral Near:     Physical Exam Vitals and nursing note reviewed.  Constitutional:      Appearance: She is well-developed.     Comments: No acute distress  HENT:     Head: Normocephalic and atraumatic.     Nose: Nose normal.  Eyes:     Conjunctiva/sclera: Conjunctivae normal.  Cardiovascular:     Rate and Rhythm: Normal rate.  Pulmonary:     Effort: Pulmonary effort is normal. No respiratory distress.  Abdominal:     General: There is no distension.  Musculoskeletal:        General: Normal range of motion.     Cervical back: Neck supple.     Comments: Left leg: Knee with mild swelling noted to medial aspect with associated faint bruising, tenderness to palpation to medial aspect of knee  extending into popliteal area and proximal posterior upper leg, mild bruising noted to anterior shin as well,, calf tenderness to palpation, but no obvious erythema, dorsalis pedis 2+  Skin:    General: Skin is warm and dry.  Neurological:     Mental Status: She is alert and oriented to person, place, and time.     UC Treatments / Results  Labs (all labs ordered are listed, but only abnormal results are displayed) Labs Reviewed - No data to display  EKG   Radiology LE VENOUS  Result Date: 08/16/2021  Lower Venous DVT Study Patient Name:  TAILA BASINSKI  Date of Exam:   08/16/2021 Medical Rec #: [id number]            Accession #:    [id number] Date of Birth: [birth year]             Patient Gender: F Patient Age:   78 years Exam Location:  [facility] Procedure:      VAS [place] LOWER EXTREMITY VENOUS (DVT) Referring Phys: Patterson Hammersmith --------------------------------------------------------------------------------  Indications: Pain.  Risk Factors: None identified. Comparison Study: No prior studies. Performing Technologist: Chanda Busing RVT  Examination Guidelines: A complete evaluation includes B-mode imaging, spectral Doppler, color Doppler, and power Doppler as needed of all accessible portions of each vessel. Bilateral testing is considered an integral part of a complete examination. Limited examinations for reoccurring indications may be performed as noted. The reflux portion of the exam is performed with the patient in reverse Trendelenburg.  +-----+---------------+---------+-----------+----------+--------------+ RIGHTCompressibilityPhasicitySpontaneityPropertiesThrombus Aging +-----+---------------+---------+-----------+----------+--------------+ CFV  Full           Yes      Yes                                 +-----+---------------+---------+-----------+----------+--------------+   +---------+---------------+---------+-----------+----------+--------------+ LEFT      CompressibilityPhasicitySpontaneityPropertiesThrombus Aging +---------+---------------+---------+-----------+----------+--------------+ CFV      Full           Yes      Yes                                 +---------+---------------+---------+-----------+----------+--------------+ SFJ      Full                                                        +---------+---------------+---------+-----------+----------+--------------+  FV Prox  Full                                                        +---------+---------------+---------+-----------+----------+--------------+ FV Mid   Full                                                        +---------+---------------+---------+-----------+----------+--------------+ FV DistalFull                                                        +---------+---------------+---------+-----------+----------+--------------+ PFV      Full                                                        +---------+---------------+---------+-----------+----------+--------------+ POP      Full           Yes      Yes                                 +---------+---------------+---------+-----------+----------+--------------+ PTV      Full                                                        +---------+---------------+---------+-----------+----------+--------------+ PERO     Full                                                        +---------+---------------+---------+-----------+----------+--------------+    Summary: RIGHT: - No evidence of common femoral vein obstruction.  LEFT: - There is no evidence of deep vein thrombosis in the lower extremity.  - No cystic structure found in the popliteal fossa.  *See table(s) above for measurements and observations. Electronically signed by Heath Lark on 08/16/2021 at 5:22:46 PM.    Final     Procedures Procedures (including critical care time)  Medications Ordered in UC Medications - No  data to display  Initial Impression / Assessment and Plan / UC Course  I have reviewed the triage vital signs and the nursing notes.  Pertinent labs & imaging results that were available during my care of the patient were reviewed by me and considered in my medical decision making (see chart for details).     Concern for possible DVT given symptoms posteriorly, history of prior PE as well as no mechanism of injury, ultrasound pending this afternoon, will call with results.  If negative recommending anti-inflammatories warm compresses compression dressings and close monitoring  over the next 24 to 48 hours.  Recommended follow-up if not improving or worsening.  DVT negative, called patient with results, will proceed with the anti-inflammatories warm compresses and close monitoring.  Discussed strict return precautions. Patient verbalized understanding and is agreeable with plan.  Final Clinical Impressions(s) / UC Diagnoses   Final diagnoses:  Pain and swelling of left lower leg     Discharge Instructions      Please go for ultrasound at the main [facility]-go to entrance C Use anti-inflammatories for pain/swelling. You may take up to 800 mg Ibuprofen every 8 hours with food. You may supplement Ibuprofen with Tylenol [phone] mg every 8 hours.  Warm compresses to back of leg I will call with results of ultrasound     ED Prescriptions     Medication Sig Dispense Auth. Provider   ibuprofen (ADVIL) 800 MG tablet Take 1 tablet (800 mg total) by mouth 3 (three) times daily. 21 tablet Derron Pipkins, [place] C, PA-C      PDMP not reviewed this encounter.   Sharyon Cable Worthing[place] C, PA-C 08/17/21 1008

## 2021-08-18 ENCOUNTER — Telehealth: Payer: Medicaid Other

## 2021-08-20 ENCOUNTER — Emergency Department
Admission: EM | Admit: 2021-08-20 | Discharge: 2021-08-20 | Disposition: A | Discharge status: Home / Self Care | Payer: Medicaid Other | Attending: Emergency Medicine | Admitting: Emergency Medicine

## 2021-08-20 ENCOUNTER — Other Ambulatory Visit: Payer: Self-pay

## 2021-08-20 ENCOUNTER — Encounter: Payer: Self-pay | Admitting: Emergency Medicine

## 2021-08-20 DIAGNOSIS — M25562 Pain in left knee: Secondary | ICD-10-CM | POA: Diagnosis not present

## 2021-08-20 DIAGNOSIS — Z5321 Procedure and treatment not carried out due to patient leaving prior to being seen by health care provider: Secondary | ICD-10-CM | POA: Insufficient documentation

## 2021-08-20 NOTE — ED Provider Notes (Signed)
   Emergency Medicine Provider Triage Evaluation Note  Katie Kidd , a 30 y.o. female  was evaluated in triage.  Pt complains of mass of her left knee.  She noticed this 2 weeks ago.  It has gotten bigger in size.  She went to urgent care for the same where they sent her for an ultrasound of her leg that was negative for DVT per her report.  Mass is painful.  She denies numbness, tingling or swelling of the left lower extremity. Review of Systems  Positive: Mass of left knee Negative: Numbness, tingling or swelling of the left lower extremity  Physical Exam  There were no vitals taken for this visit. Gen:   Awake, no distress   Resp:  Normal effort  MSK:   Normal flexion-extension of the left knee Skin:  1.5 cm palpable mobile hard mass noted of the left medial knee over the joint line  Medical Decision Making  Medically screening exam initiated at 2:07 PM.  Appropriate orders placed.  Katie Kidd was informed that the remainder of the evaluation will be completed by another provider, this initial triage assessment does not replace that evaluation, and the importance of remaining in the ED until their evaluation is complete.      Lorre Munroe, NP 08/20/21 1421    Gilles Chiquito, MD 08/20/21 [phone]

## 2021-08-20 NOTE — ED Triage Notes (Signed)
Pt called from [facility] to treatment room, no response 

## 2021-08-20 NOTE — ED Triage Notes (Signed)
Pt called from [facility] to treatment room, no repsonse 

## 2021-08-20 NOTE — ED Triage Notes (Signed)
Pt reports had a knot come up on her left knee over a week ago and went to [facility] to [facility] but It was negative. Pt reports was told to keep an eye on it and if it  got worse to come back

## 2021-08-21 ENCOUNTER — Ambulatory Visit
Admission: EM | Admit: 2021-08-21 | Discharge: 2021-08-21 | Disposition: A | Discharge status: Home / Self Care | Payer: Medicaid Other

## 2021-08-21 DIAGNOSIS — R2242 Localized swelling, mass and lump, left lower limb: Secondary | ICD-10-CM

## 2021-08-21 NOTE — ED Triage Notes (Signed)
Pt reports having knot to left inner knee and having bruising to the area. Pt states when pressure is applied there is a sharp, throbbing pain up the leg.  Started about 6 days ago   Patient is ambulatory, gait is steady.

## 2021-08-21 NOTE — ED Provider Notes (Signed)
[facility]    CSN: [id number] Arrival date & time: 08/21/21  0954      History   Chief Complaint Chief Complaint  Patient presents with   Leg Pain    HPI Adali Quarry is a 30 y.o. female.  Approximately a week ago, patient noticed a painful lump on her left medial knee.  Was seen in urgent care, had lower extremity ultrasound to rule out DVT.  Report shows no DVT identified and no cystic structures in the knee.  Lump became surrounded with edema and some bruising.  Patient denies injury, trauma, prior surgery to the area.  Patient has been using ibuprofen and warm compresses to the area which has helped reduce the swelling and bruising, but the painful lump has grown in size and is not relieved by ibuprofen.  When she moves her knee or walks, she experiences pain shooting up the medial aspect of her thigh.   Leg Pain  Past Medical History:  Diagnosis Date   Asthma     Patient Active Problem List   Diagnosis Date Noted   Acute gallstone pancreatitis 03/29/2021   Gallstone pancreatitis 03/27/2021    History reviewed. No pertinent surgical history.  OB History   No obstetric history on file.      Home Medications    Prior to Admission medications   Medication Sig Start Date End Date Taking? Authorizing Provider  acetaminophen (TYLENOL) 500 MG tablet Take 500 mg by mouth every 6 (six) hours as needed for moderate pain, mild pain, fever or headache.    [provider]  ibuprofen (ADVIL) 800 MG tablet Take 1 tablet (800 mg total) by mouth 3 (three) times daily. 08/16/21   Wieters, Hallie C, PA-C  ondansetron (ZOFRAN ODT) 4 MG disintegrating tablet Take 1 tablet (4 mg total) by mouth every 8 (eight) hours as needed for nausea or vomiting. 08/02/21   Merwyn Katos, MD    Family History History reviewed. No pertinent family history.  Social History Social History   Tobacco Use   Smoking status: Never   Smokeless tobacco: Never  Vaping Use    Vaping Use: Some days  Substance Use Topics   Alcohol use: Not Currently    Comment: social   Drug use: Never     Allergies   Patient has no known allergies.   Review of Systems Review of Systems   Physical Exam Triage Vital Signs ED Triage Vitals  Enc Vitals Group     BP 08/21/21 1017 116/78     Pulse Rate 08/21/21 1017 96     Resp 08/21/21 1017 18     Temp 08/21/21 1017 98.3 F (36.8 C)     Temp Source 08/21/21 1017 Oral     SpO2 08/21/21 1017 98 %     Weight --      Height --      Head Circumference --      Peak Flow --      Pain Score 08/21/21 1019 10     Pain Loc --      Pain Edu? --      Excl. in GC? --    No data found.  Updated Vital Signs BP 116/78 (BP Location: Right Arm)   Pulse 96   Temp 98.3 F (36.8 C) (Oral)   Resp 18   LMP 08/02/2021   SpO2 98%   Visual Acuity Right Eye Distance:   Left Eye Distance:   Bilateral Distance:  Right Eye Near:   Left Eye Near:    Bilateral Near:     Physical Exam Constitutional:      Appearance: Normal appearance. She is not ill-appearing.  Musculoskeletal:     Left knee: Swelling present. No erythema or bony tenderness.  Neurological:     Mental Status: She is alert.     Comments: 0.5 x 0.75 cm discrete mass palpable in the left medial knee area.  It is in the subcutaneous tissue of the knee.  Exquisitely painful to palpation.  Patient can palpate medial thigh and cause the  pain in the mass to increase     UC Treatments / Results  Labs (all labs ordered are listed, but only abnormal results are displayed) Labs Reviewed - No data to display  EKG   Radiology No results found.  Procedures Procedures (including critical care time)  Medications Ordered in UC Medications - No data to display  Initial Impression / Assessment and Plan / UC Course  I have reviewed the triage vital signs and the nursing notes.  Pertinent labs & imaging results that were available during my care of the  patient were reviewed by me and considered in my medical decision making (see chart for details).  I am concerned patient could have some kind of neuroma.  I think she needs imaging of the painful mass and careful follow-up.  This is beyond what I can order in urgent care.  Patient needs primary care.  Shared primary care options through [facility] in the [place] area where she lives.  Advised her to continue warm compresses and ibuprofen for now  Final Clinical Impressions(s) / UC Diagnoses   Final diagnoses:  Mass of left lower extremity     Discharge Instructions      Follow up with a primary care provider. [facility] Modena in [place] and [facility] in [place] are taking new patients.      ED Prescriptions   None    PDMP not reviewed this encounter.   Cathlyn Parsons, NP 08/21/21 1150

## 2021-08-21 NOTE — Discharge Instructions (Signed)
Follow up with a primary care provider. Katie Kidd in [place] and [facility] in [place] are taking new patients.

## 2021-08-24 ENCOUNTER — Ambulatory Visit (INDEPENDENT_AMBULATORY_CARE_PROVIDER_SITE_OTHER): Payer: Medicaid Other | Admitting: Family Medicine

## 2021-08-24 ENCOUNTER — Encounter: Payer: Self-pay | Admitting: Family Medicine

## 2021-08-24 ENCOUNTER — Inpatient Hospital Stay (INDEPENDENT_AMBULATORY_CARE_PROVIDER_SITE_OTHER): Payer: Medicaid Other | Admitting: Radiology

## 2021-08-24 ENCOUNTER — Other Ambulatory Visit: Payer: Self-pay

## 2021-08-24 VITALS — BP 98/66 | HR 88 | Temp 98.2°F | Ht 63.0 in | Wt 114.0 lb

## 2021-08-24 DIAGNOSIS — M7052 Other bursitis of knee, left knee: Secondary | ICD-10-CM

## 2021-08-24 DIAGNOSIS — R2242 Localized swelling, mass and lump, left lower limb: Secondary | ICD-10-CM

## 2021-08-24 DIAGNOSIS — S76319A Strain of muscle, fascia and tendon of the posterior muscle group at thigh level, unspecified thigh, initial encounter: Secondary | ICD-10-CM | POA: Diagnosis not present

## 2021-08-24 MED ORDER — DICLOFENAC POTASSIUM 50 MG PO TABS: 50.0000 mg | ORAL_TABLET | Freq: Two times a day (BID) | ORAL | 0 refills | Status: DC

## 2021-08-24 NOTE — Patient Instructions (Addendum)
-   Start diclofenac twice daily with food (stop ibuprofen) x 2 weeks - After 2 weeks can dose diclofenac twice daily on an as-needed basis - Start home exercises this weekend and focus on gradual advance - Can utilize warm compress and ice at 20 minute intervals - Return in 4 weeks

## 2021-08-24 NOTE — Assessment & Plan Note (Addendum)
Patient presents with atraumatic 1.5-week history of posterior medial left knee pain associated with swelling, minor bruising, this is progressed to involve anteromedial knee where there is swelling noted as well.  She works in [employer] with mostly sitting/sedentary activity.  She denies any change in activities or relative overuse.  She did seek evaluation twice on 10/2 and 10/3 at the ED and urgent care respectively where DVT was ruled out, was advised further evaluation and our office.  She has been dosing ibuprofen 800 mg every 6 hours and using heat with limited response, pain with ambulation and particularly stairs.  Denies any paresthesias, no radiation proximally or distally along the ipsilateral leg, no prior history of the same.  Outside notes inclusive of ED visit, urgent care visit, and ultrasound report were reviewed.  Physical examination reveals focal tenderness, swelling, faint ecchymosis that is resolving at the medial hamstring tendons insertionally, additionally focal tenderness and swelling at the pes anserine bursa.  Real-time sonographic bedside evaluation was performed today as well.  Patient has clinical features consistent with grade 2 hamstring strain and tendinopathy with concomitant pes anserine tendinopathy/bursitis with most likely risk factor deconditioning/tight structures along the posterior chain.  I have reviewed the same with the patient as well as treatment strategies, she will transition to diclofenac 50 mg twice daily x2 weeks then as needed, initiate a home-based rehab program, and maintain close follow-up in 4 weeks time.  If suboptimal progress noted, local injections and formal physical therapy to be considered.

## 2021-08-24 NOTE — Progress Notes (Signed)
Primary Care / Sports Medicine Office Visit  Patient Information:  Patient ID: Katie Kidd, female DOB: 1991-08-30 Age: 30 y.o. MRN: [id number]   Katie Kidd is a pleasant 30 y.o. female presenting with the following:  Chief Complaint  Patient presents with   New Patient (Initial Visit)   Establish Care   Leg Pain    Left medial knee; x1.5 weeks; seen by Urgent Care 08/21/21, but has gotten worse per patient; no known injury or trauma; [place] negative for DVT; 10/10 pain    Review of Systems pertinent details above   Patient Active Problem List   Diagnosis Date Noted   Lower leg mass, left 08/24/2021   Strain of insertion of tendon of hamstring muscle 08/24/2021   Pes anserinus bursitis of left knee 08/24/2021   Acute gallstone pancreatitis 03/29/2021   Gallstone pancreatitis 03/27/2021   Past Medical History:  Diagnosis Date   Asthma    Outpatient Encounter Medications as of 08/24/2021  Medication Sig   acetaminophen (TYLENOL) 500 MG tablet Take 500 mg by mouth every 6 (six) hours as needed for moderate pain, mild pain, fever or headache.   diclofenac (CATAFLAM) 50 MG tablet Take 1 tablet (50 mg total) by mouth 2 (two) times daily.   ondansetron (ZOFRAN ODT) 4 MG disintegrating tablet Take 1 tablet (4 mg total) by mouth every 8 (eight) hours as needed for nausea or vomiting.   [DISCONTINUED] ibuprofen (ADVIL) 800 MG tablet Take 1 tablet (800 mg total) by mouth 3 (three) times daily.   No facility-administered encounter medications on file as of 08/24/2021.   Past Surgical History:  Procedure Laterality Date   CHOLECYSTECTOMY  03/2021   LYMPH NODE BIOPSY Bilateral 2016    Vitals:   08/24/21 0812  BP: 98/66  Pulse: 88  Temp: 98.2 F (36.8 C)  SpO2: 99%   Vitals:   08/24/21 0812  Weight: 114 lb (51.7 kg)  Height: 5\' 3"  (1.6 m)   Body mass index is 20.19 kg/m.  DG Chest 2 View  Result Date: 08/02/2021 CLINICAL DATA:  Chest pain. EXAM: CHEST - 2  VIEW COMPARISON:  Chest radiograph, 03/27/2021.  CT chest, 03/26/2021. FINDINGS: Cardiomediastinal silhouette is within normal limits. Lungs are well inflated. No focal consolidation. No pleural effusion or. No acute osseous abnormality. IMPRESSION: Normal chest. Electronically Signed   By: 05/26/2021 M.D.   On: 08/02/2021 08:41   LE VENOUS  Result Date: 08/16/2021  Lower Venous DVT Study Patient Name:  Katie Kidd  Date of Exam:   08/16/2021 Medical Rec #: 08/18/2021            Accession #:    [id number] Date of Birth: [birth year]             Patient Gender: F Patient Age:   64 years Exam Location:  [facility] Procedure:      V[facility] LOWER EXTREMITY VENOUS (DVT) Referring Phys: [place] --------------------------------------------------------------------------------  Indications: Pain.  Risk Factors: None identified. Comparison Study: No prior studies. Performing Technologist: Patterson Hammersmith RVT  Examination Guidelines: A complete evaluation includes B-mode imaging, spectral Doppler, color Doppler, and power Doppler as needed of all accessible portions of each vessel. Bilateral testing is considered an integral part of a complete examination. Limited examinations for reoccurring indications may be performed as noted. The reflux portion of the exam is performed with the patient in reverse Trendelenburg.  +-----+---------------+---------+-----------+----------+--------------+ RIGHTCompressibilityPhasicitySpontaneityPropertiesThrombus Aging +-----+---------------+---------+-----------+----------+--------------+ CFV  Full  Yes      Yes                                 +-----+---------------+---------+-----------+----------+--------------+   +---------+---------------+---------+-----------+----------+--------------+ LEFT     CompressibilityPhasicitySpontaneityPropertiesThrombus Aging +---------+---------------+---------+-----------+----------+--------------+ CFV       Full           Yes      Yes                                 +---------+---------------+---------+-----------+----------+--------------+ SFJ      Full                                                        +---------+---------------+---------+-----------+----------+--------------+ FV Prox  Full                                                        +---------+---------------+---------+-----------+----------+--------------+ FV Mid   Full                                                        +---------+---------------+---------+-----------+----------+--------------+ FV DistalFull                                                        +---------+---------------+---------+-----------+----------+--------------+ PFV      Full                                                        +---------+---------------+---------+-----------+----------+--------------+ POP      Full           Yes      Yes                                 +---------+---------------+---------+-----------+----------+--------------+ PTV      Full                                                        +---------+---------------+---------+-----------+----------+--------------+ PERO     Full                                                        +---------+---------------+---------+-----------+----------+--------------+    Summary:  RIGHT: - No evidence of common femoral vein obstruction.  LEFT: - There is no evidence of deep vein thrombosis in the lower extremity.  - No cystic structure found in the popliteal fossa.  *See table(s) above for measurements and observations. Electronically signed by Heath Lark on 08/16/2021 at 5:22:46 PM.    Final      Independent interpretation of notes and tests performed by another provider:   None  Procedures performed:   Procedure:  Evaluation of left posterior medial and left anteromedial knee swelling under ultrasound guidance. Ultrasound guidance  utilized revealing peritendinous hypoechoic region consistent with edema, additionally noted at the pes anserine bursa Samsung HS60 device utilized with permanent recording / reporting. Consent obtained and verified. Completed without difficulty and tolerated well.   Pertinent History, Exam, Impression, and Recommendations:   Strain of insertion of tendon of hamstring muscle Patient presents with atraumatic 1.5-week history of posterior medial left knee pain associated with swelling, minor bruising, this is progressed to involve anteromedial knee where there is swelling noted as well.  She works in Office manager with mostly sitting/sedentary activity.  She denies any change in activities or relative overuse.  She did seek evaluation twice on 10/2 and 10/3 at the ED and urgent care respectively where DVT was ruled out, was advised further evaluation and our office.  She has been dosing ibuprofen 800 mg every 6 hours and using heat with limited response, pain with ambulation and particularly stairs.  Denies any paresthesias, no radiation proximally or distally along the ipsilateral leg, no prior history of the same.  Outside notes inclusive of ED visit, urgent care visit, and ultrasound report were reviewed.  Physical examination reveals focal tenderness, swelling, faint ecchymosis that is resolving at the medial hamstring tendons insertionally, additionally focal tenderness and swelling at the pes anserine bursa.  Real-time sonographic bedside evaluation was performed today as well.  Patient has clinical features consistent with grade 2 hamstring strain and tendinopathy with concomitant pes anserine tendinopathy/bursitis with most likely risk factor deconditioning/tight structures along the posterior chain.  I have reviewed the same with the patient as well as treatment strategies, she will transition to diclofenac 50 mg twice daily x2 weeks then as needed, initiate a home-based rehab program, and maintain  close follow-up in 4 weeks time.  If suboptimal progress noted, local injections and formal physical therapy to be considered.  Pes anserinus bursitis of left knee See additional assessment(s) for plan details.   Orders & Medications Meds ordered this encounter  Medications   diclofenac (CATAFLAM) 50 MG tablet    Sig: Take 1 tablet (50 mg total) by mouth 2 (two) times daily.    Dispense:  60 tablet    Refill:  0   Orders Placed This Encounter  Procedures   [place] LIMITED JOINT SPACE STRUCTURES LOW LEFT     Return in about 4 weeks (around 09/21/2021).     Jerrol Banana, MD   Primary Care Sports Medicine [facility]

## 2021-08-24 NOTE — Assessment & Plan Note (Signed)
See additional assessment(s) for plan details. 

## 2021-09-12 ENCOUNTER — Encounter: Payer: Self-pay | Admitting: Emergency Medicine

## 2021-09-12 ENCOUNTER — Other Ambulatory Visit: Payer: Self-pay

## 2021-09-12 ENCOUNTER — Emergency Department
Admission: EM | Admit: 2021-09-12 | Discharge: 2021-09-12 | Disposition: A | Discharge status: Home / Self Care | Payer: Medicaid Other | Attending: Emergency Medicine | Admitting: Emergency Medicine

## 2021-09-12 ENCOUNTER — Emergency Department: Payer: Medicaid Other

## 2021-09-12 DIAGNOSIS — J45901 Unspecified asthma with (acute) exacerbation: Secondary | ICD-10-CM | POA: Insufficient documentation

## 2021-09-12 DIAGNOSIS — Z20822 Contact with and (suspected) exposure to covid-19: Secondary | ICD-10-CM | POA: Diagnosis not present

## 2021-09-12 DIAGNOSIS — R0602 Shortness of breath: Secondary | ICD-10-CM | POA: Diagnosis present

## 2021-09-12 LAB — RESP PANEL BY RT-PCR (FLU A&B, COVID) ARPGX2
Influenza A by PCR: NEGATIVE
Influenza B by PCR: NEGATIVE
SARS Coronavirus 2 by RT PCR: NEGATIVE

## 2021-09-12 MED ORDER — IPRATROPIUM-ALBUTEROL 0.5-2.5 (3) MG/3ML IN SOLN
RESPIRATORY_TRACT | Status: AC
  Administered 2021-09-12: 3 mL via RESPIRATORY_TRACT
  Filled 2021-09-12: qty 9

## 2021-09-12 MED ORDER — METHYLPREDNISOLONE SODIUM SUCC 125 MG IJ SOLR
INTRAMUSCULAR | Status: AC
  Administered 2021-09-12: 125 mg via INTRAVENOUS
  Filled 2021-09-12: qty 2

## 2021-09-12 MED ORDER — ALBUTEROL SULFATE HFA 108 (90 BASE) MCG/ACT IN AERS: 2.0000 | INHALATION_SPRAY | Freq: Four times a day (QID) | RESPIRATORY_TRACT | 2 refills | Status: AC | PRN

## 2021-09-12 MED ORDER — MAGNESIUM SULFATE 2 GM/50ML IV SOLN: 2.0000 g | Freq: Once | INTRAVENOUS | Status: AC

## 2021-09-12 MED ORDER — MAGNESIUM SULFATE 2 GM/50ML IV SOLN
INTRAVENOUS | Status: AC
  Administered 2021-09-12: 2 g via INTRAVENOUS
  Filled 2021-09-12: qty 50

## 2021-09-12 MED ORDER — IPRATROPIUM-ALBUTEROL 0.5-2.5 (3) MG/3ML IN SOLN
3.0000 mL | Freq: Once | RESPIRATORY_TRACT | Status: AC
  Administered 2021-09-12: 3 mL via RESPIRATORY_TRACT

## 2021-09-12 MED ORDER — PREDNISONE 20 MG PO TABS: 60.0000 mg | ORAL_TABLET | Freq: Every day | ORAL | 0 refills | Status: AC

## 2021-09-12 MED ORDER — IPRATROPIUM-ALBUTEROL 0.5-2.5 (3) MG/3ML IN SOLN: 3.0000 mL | Freq: Once | RESPIRATORY_TRACT | Status: AC

## 2021-09-12 MED ORDER — METHYLPREDNISOLONE SODIUM SUCC 125 MG IJ SOLR: 125.0000 mg | Freq: Once | INTRAMUSCULAR | Status: AC

## 2021-09-12 NOTE — Discharge Instructions (Signed)

## 2021-09-12 NOTE — ED Triage Notes (Addendum)
Patient ambulatory to triage with steady gait, without difficulty; pt reports asthma attack unrelieved by albuterol; denies any recent illness; pt with prolonged exp, diminished BS

## 2021-09-12 NOTE — ED Provider Notes (Signed)
[facility] Emergency Department Provider Note  ____________________________________________  Time seen: Approximately 4:29 AM  I have reviewed the triage vital signs and the nursing notes.   HISTORY  Chief Complaint Asthma   HPI Katie Kidd is a 30 y.o. female with a history of asthma who presents for evaluation of shortness of breath.  Patient has had a dry cough for 2 days.  Tonight has had progressively worsening shortness of breath and wheezing.  Has been using her inhalers but they stopped working.  No fever or chills, no chest pain, no personal or family history of PE or DVT, no recent travel immobilization, no leg pain or swelling, no hemoptysis or exogenous hormones.  No nausea, vomiting, diarrhea.  Shortness of breath is constant and severe at this time.   Past Medical History:  Diagnosis Date   Asthma     Patient Active Problem List   Diagnosis Date Noted   Lower leg mass, left 08/24/2021   Strain of insertion of tendon of hamstring muscle 08/24/2021   Pes anserinus bursitis of left knee 08/24/2021   Acute gallstone pancreatitis 03/29/2021   Gallstone pancreatitis 03/27/2021    Past Surgical History:  Procedure Laterality Date   CHOLECYSTECTOMY  03/2021   LYMPH NODE BIOPSY Bilateral 2016    Prior to Admission medications   Medication Sig Start Date End Date Taking? Authorizing Provider  albuterol (VENTOLIN HFA) 108 (90 Base) MCG/ACT inhaler Inhale 2 puffs into the lungs every 6 (six) hours as needed for wheezing or shortness of breath. 09/12/21  Yes Don Perking, [place], MD  predniSONE (DELTASONE) 20 MG tablet Take 3 tablets (60 mg total) by mouth daily with breakfast for 4 days. 09/12/21 09/16/21 Yes Auden Wettstein, [place], MD  acetaminophen (TYLENOL) 500 MG tablet Take 500 mg by mouth every 6 (six) hours as needed for moderate pain, mild pain, fever or headache.    [provider]  diclofenac (CATAFLAM) 50 MG tablet Take 1  tablet (50 mg total) by mouth 2 (two) times daily. 08/24/21   Jerrol Banana, MD  ondansetron (ZOFRAN ODT) 4 MG disintegrating tablet Take 1 tablet (4 mg total) by mouth every 8 (eight) hours as needed for nausea or vomiting. 08/02/21   Merwyn Katos, MD    Allergies Patient has no known allergies.  Family History  Problem Relation Age of Onset   Asthma Sister    Asthma Brother    Diabetes Maternal Uncle    Lung cancer Maternal Uncle    Heart disease Maternal Grandmother    Lung disease Maternal Grandmother    Stomach cancer Maternal Grandfather    Heart attack Paternal Grandmother     Social History Social History   Tobacco Use   Smoking status: Never   Smokeless tobacco: Never  Vaping Use   Vaping Use: Former  Substance Use Topics   Alcohol use: Never   Drug use: Never    Review of Systems  Constitutional: Negative for fever. Eyes: Negative for visual changes. ENT: Negative for sore throat. Neck: No neck pain  Cardiovascular: Negative for chest pain. Respiratory: + shortness of breath, cough, wheezing Gastrointestinal: Negative for abdominal pain, vomiting or diarrhea. Genitourinary: Negative for dysuria. Musculoskeletal: Negative for back pain. Skin: Negative for rash. Neurological: Negative for headaches, weakness or numbness. Psych: No SI or HI  ____________________________________________   PHYSICAL EXAM:  VITAL SIGNS: ED Triage Vitals  Enc Vitals Group     BP 09/12/21 0403 113/86     Pulse --  Resp 09/12/21 0403 (!) 24     Temp 09/12/21 0403 98.7 F (37.1 C)     Temp Source 09/12/21 0403 Oral     SpO2 09/12/21 0403 (!) 87 %     Weight 09/12/21 0403 114 lb (51.7 kg)     Height 09/12/21 0403 5\' 3"  (1.6 m)     Head Circumference --      Peak Flow --      Pain Score 09/12/21 0407 0     Pain Loc --      Pain Edu? --      Excl. in GC? --     Constitutional: Alert and oriented, moderate respiratory distress. HEENT:      Head:  Normocephalic and atraumatic.         Eyes: Conjunctivae are normal. Sclera is non-icteric.       Mouth/Throat: Mucous membranes are moist.       Neck: Supple with no signs of meningismus. Cardiovascular: Regular rate and rhythm. No murmurs, gallops, or rubs. 2+ symmetrical distal pulses are present in all extremities. No JVD. Respiratory: Patient arrives in moderate respiratory distress, tripoding, tachypneic, satting 87% on room air, severely diminished air movement on the right and wheezing on the left Gastrointestinal: Soft, non tender, and non distended with positive bowel sounds. No rebound or guarding. Genitourinary: No CVA tenderness. Musculoskeletal:  No edema, cyanosis, or erythema of extremities. Neurologic: Normal speech and language. Face is symmetric. Moving all extremities. No gross focal neurologic deficits are appreciated. Skin: Skin is warm, dry and intact. No rash noted. Psychiatric: Mood and affect are normal. Speech and behavior are normal.  ____________________________________________   LABS (all labs ordered are listed, but only abnormal results are displayed)  Labs Reviewed  RESP PANEL BY RT-PCR (FLU A&B, COVID) ARPGX2   ____________________________________________  EKG  none  ____________________________________________  RADIOLOGY  I have personally reviewed the images performed during this visit and I agree with the Radiologist's read.   Interpretation by Radiologist:  DG Chest Portable 1 View  Result Date: 09/12/2021 CLINICAL DATA:  30 year old female with history of shortness of breath. EXAM: PORTABLE CHEST 1 VIEW COMPARISON:  Chest x-ray 08/02/2021. FINDINGS: Lung volumes are normal. No consolidative airspace disease. No pleural effusions. No pneumothorax. No pulmonary nodule or mass noted. Pulmonary vasculature and the cardiomediastinal silhouette are within normal limits. IMPRESSION: No radiographic evidence of acute cardiopulmonary disease.  Electronically Signed   By: 08/04/2021 M.D.   On: 09/12/2021 04:26     ____________________________________________   PROCEDURES  Procedure(s) performed: None Procedures   Critical Care performed:  None ____________________________________________   INITIAL IMPRESSION / ASSESSMENT AND PLAN / ED COURSE  30 y.o. female with a history of asthma who presents for evaluation of shortness of breath, wheezing and cough.  Patient arrives in moderate respiratory distress, tripoding, tachypneic, hypoxic to 87%.  Severely diminished air movement on the right and wheezing on the left.  Chest x-ray with no signs of pneumothorax or pneumonia or edema.  COVID and flu pending.  Will give duo nebs x3, Solu-Medrol, IV magnesium for asthma exacerbation.  Ddx asthma exacerbation versus flu versus COVID versus pneumonia versus pericarditis versus pericarditis versus PE.  Patient presents telemetry for close monitoring of cardiorespiratory status.  Old medical records reviewed  ED COURSE: Patient monitored after duonebs, mag, and steroids.  Patient felt markedly improved, good air movement bilaterally with no wheezing, normal work of breathing, normal sats, laying in bed comfortably without any distress.  COVID and flu negative.  Repeat vital signs within normal limits.  Patient was discharged home on steroids, albuterol, close follow-up with PCP.  My standard return precautions were discussed with patient and family member.      _____________________________________________ Please note:  Patient was evaluated in Emergency Department today for the symptoms described in the history of present illness. Patient was evaluated in the context of the global COVID-19 pandemic, which necessitated consideration that the patient might be at risk for infection with the SARS-CoV-2 virus that causes COVID-19. Institutional protocols and algorithms that pertain to the evaluation of patients at risk for COVID-19 are in  a state of rapid change based on information released by regulatory bodies including the CDC and federal and state organizations. These policies and algorithms were followed during the patient's care in the ED.  Some ED evaluations and interventions may be delayed as a result of limited staffing during the pandemic.   [place] Controlled Substance Database was reviewed by me. ____________________________________________   FINAL CLINICAL IMPRESSION(S) / ED DIAGNOSES   Final diagnoses:  Exacerbation of asthma, unspecified asthma severity, unspecified whether persistent      NEW MEDICATIONS STARTED DURING THIS VISIT:  ED Discharge Orders          Ordered    predniSONE (DELTASONE) 20 MG tablet  Daily with breakfast        09/12/21 0542    albuterol (VENTOLIN HFA) 108 (90 Base) MCG/ACT inhaler  Every 6 hours PRN        09/12/21 0542             Note:  This document was prepared using Dragon voice recognition software and may include unintentional dictation errors.    Nita Sickle, MD 09/12/21 [phone]

## 2021-09-21 ENCOUNTER — Ambulatory Visit: Payer: Medicaid Other | Admitting: Family Medicine

## 2021-10-17 ENCOUNTER — Emergency Department: Payer: Medicaid Other

## 2021-10-17 ENCOUNTER — Emergency Department
Admission: EM | Admit: 2021-10-17 | Discharge: 2021-10-17 | Disposition: A | Discharge status: Home / Self Care | Payer: Medicaid Other | Attending: Emergency Medicine | Admitting: Emergency Medicine

## 2021-10-17 ENCOUNTER — Encounter: Payer: Self-pay | Admitting: Intensive Care

## 2021-10-17 ENCOUNTER — Other Ambulatory Visit: Payer: Self-pay

## 2021-10-17 DIAGNOSIS — M25512 Pain in left shoulder: Secondary | ICD-10-CM | POA: Diagnosis present

## 2021-10-17 DIAGNOSIS — D509 Iron deficiency anemia, unspecified: Secondary | ICD-10-CM | POA: Diagnosis not present

## 2021-10-17 DIAGNOSIS — W11XXXA Fall on and from ladder, initial encounter: Secondary | ICD-10-CM | POA: Insufficient documentation

## 2021-10-17 DIAGNOSIS — W19XXXA Unspecified fall, initial encounter: Secondary | ICD-10-CM

## 2021-10-17 DIAGNOSIS — J45909 Unspecified asthma, uncomplicated: Secondary | ICD-10-CM | POA: Insufficient documentation

## 2021-10-17 DIAGNOSIS — Z20822 Contact with and (suspected) exposure to covid-19: Secondary | ICD-10-CM | POA: Insufficient documentation

## 2021-10-17 DIAGNOSIS — R509 Fever, unspecified: Secondary | ICD-10-CM

## 2021-10-17 LAB — COMPREHENSIVE METABOLIC PANEL
ALT: 12 U/L (ref 0–44)
AST: 28 U/L (ref 15–41)
Albumin: 4 g/dL (ref 3.5–5.0)
Alkaline Phosphatase: 55 U/L (ref 38–126)
Anion gap: 6 (ref 5–15)
BUN: 5 mg/dL — ABNORMAL LOW (ref 6–20)
CO2: 22 mmol/L (ref 22–32)
Calcium: 8.4 mg/dL — ABNORMAL LOW (ref 8.9–10.3)
Chloride: 107 mmol/L (ref 98–111)
Creatinine, Ser: 0.75 mg/dL (ref 0.44–1.00)
GFR, Estimated: >60 mL/min (ref >60)
Glucose, Bld: 98 mg/dL (ref 70–99)
Potassium: 2.9 mmol/L — ABNORMAL LOW (ref 3.5–5.1)
Sodium: 135 mmol/L (ref 135–145)
Total Bilirubin: 0.9 mg/dL (ref 0.3–1.2)
Total Protein: 7.3 g/dL (ref 6.5–8.1)

## 2021-10-17 LAB — POC URINE PREG, ED: Preg Test, Ur: NEGATIVE

## 2021-10-17 LAB — CBC WITH DIFFERENTIAL/PLATELET
Abs Immature Granulocytes: 0.01 10*3/uL (ref 0.00–0.07)
Basophils Absolute: 0 10*3/uL (ref 0.0–0.1)
Basophils Relative: 0 %
Eosinophils Absolute: 0.1 10*3/uL (ref 0.0–0.5)
Eosinophils Relative: 2 %
HCT: 25.4 % — ABNORMAL LOW (ref 36.0–46.0)
Hemoglobin: 7.3 g/dL — ABNORMAL LOW (ref 12.0–15.0)
Immature Granulocytes: 0 %
Lymphocytes Relative: 28 %
Lymphs Abs: 1.2 10*3/uL (ref 0.7–4.0)
MCH: 17.9 pg — ABNORMAL LOW (ref 26.0–34.0)
MCHC: 28.7 g/dL — ABNORMAL LOW (ref 30.0–36.0)
MCV: 62.4 fL — ABNORMAL LOW (ref 80.0–100.0)
Monocytes Absolute: 0.5 10*3/uL (ref 0.1–1.0)
Monocytes Relative: 12 %
Neutro Abs: 2.4 10*3/uL (ref 1.7–7.7)
Neutrophils Relative %: 58 %
Platelets: 297 10*3/uL (ref 150–400)
RBC: 4.07 MIL/uL (ref 3.87–5.11)
RDW: 17.9 % — ABNORMAL HIGH (ref 11.5–15.5)
WBC: 4.1 10*3/uL (ref 4.0–10.5)
nRBC: 0 % (ref 0.0–0.2)

## 2021-10-17 LAB — URINALYSIS, COMPLETE (UACMP) WITH MICROSCOPIC
Bilirubin Urine: NEGATIVE
Glucose, UA: NEGATIVE mg/dL
Ketones, ur: NEGATIVE mg/dL
Leukocytes,Ua: NEGATIVE
Nitrite: NEGATIVE
Protein, ur: NEGATIVE mg/dL
Specific Gravity, Urine: 1.01 (ref 1.005–1.030)
pH: 5.5 (ref 5.0–8.0)

## 2021-10-17 LAB — RESP PANEL BY RT-PCR (FLU A&B, COVID) ARPGX2
Influenza A by PCR: NEGATIVE
Influenza B by PCR: NEGATIVE
SARS Coronavirus 2 by RT PCR: NEGATIVE

## 2021-10-17 LAB — PROCALCITONIN: Procalcitonin: <0.1 ng/mL

## 2021-10-17 LAB — LACTIC ACID, PLASMA: Lactic Acid, Venous: 1.2 mmol/L (ref 0.5–1.9)

## 2021-10-17 MED ORDER — ACETAMINOPHEN 325 MG PO TABS
650.0000 mg | ORAL_TABLET | Freq: Once | ORAL | Status: AC | PRN
  Administered 2021-10-17: 650 mg via ORAL

## 2021-10-17 MED ORDER — LACTATED RINGERS IV BOLUS
1000.0000 mL | Freq: Once | INTRAVENOUS | Status: AC
  Administered 2021-10-17: 1000 mL via INTRAVENOUS

## 2021-10-17 MED ORDER — KETOROLAC TROMETHAMINE 30 MG/ML IJ SOLN
15.0000 mg | Freq: Once | INTRAMUSCULAR | Status: AC
  Administered 2021-10-17: 15 mg via INTRAVENOUS

## 2021-10-17 NOTE — ED Notes (Signed)
Patient ambulated to hallway bathroom with a steady gait. 

## 2021-10-17 NOTE — ED Notes (Signed)
Patient taken to imaging. 

## 2021-10-17 NOTE — ED Notes (Signed)
Patient denies covid/flu symptoms. Patient states she had a negative Covid test 3 days ago.

## 2021-10-17 NOTE — ED Triage Notes (Signed)
Patient reports she was on a ladder (half way up) at work and fell off to the ground. C/o left shoulder pain and left foot pain. Patient has fever in triage. Denies being sick recently.

## 2021-10-17 NOTE — ED Notes (Signed)
Pt transported to xray 

## 2021-10-17 NOTE — ED Provider Notes (Signed)
[facility] Emergency Department Provider Note ____________________________________________   Event Date/Time   First MD Initiated Contact with Patient 10/17/21 1511     (approximate)  I have reviewed the triage vital signs and the nursing notes.  HISTORY  Chief Complaint Fall and Fever   HPI Katie Kidd is a 30 y.o. femalewho presents to the ED for evaluation of fall at work and incidental fever.   Chart review indicates asthma.   Patient presents to the ED from her workplace for evaluation of an accidental fall from a ladder.  She was putting a box on the top shelf when she slid off the ladder, striking the anterior aspect of her left shoulder and the dorsal aspect of her left foot on the lateral and the way down.  Denies head injury, syncope or striking anything on the ground, just hitting the ladder on the way down.  Her boss told her to get checked out due to the pain in her left shoulder.  She reports moderate intensity aching pain to her left shoulder anteriorly.  Lesser pain to her left foot.  She has been ambulatory since the event.  No syncope, emesis or other traumas.  She is noted to be febrile and tachycardic in triage.  She did not know that she had a fever.  Denies recent illnesses.  Reports that she feels fine, beyond the mild soreness to her left shoulder. Denies abdominal pain, shortness of breath, chest pain, nausea, vomiting, diarrhea or dysuria.  Past Medical History:  Diagnosis Date   Asthma     Patient Active Problem List   Diagnosis Date Noted   Lower leg mass, left 08/24/2021   Strain of insertion of tendon of hamstring muscle 08/24/2021   Pes anserinus bursitis of left knee 08/24/2021   Acute gallstone pancreatitis 03/29/2021   Gallstone pancreatitis 03/27/2021    Past Surgical History:  Procedure Laterality Date   CHOLECYSTECTOMY  03/2021   LYMPH NODE BIOPSY Bilateral 2016    Prior to Admission medications    Medication Sig Start Date End Date Taking? Authorizing Provider  acetaminophen (TYLENOL) 500 MG tablet Take 500 mg by mouth every 6 (six) hours as needed for moderate pain, mild pain, fever or headache.    [provider]  albuterol (VENTOLIN HFA) 108 (90 Base) MCG/ACT inhaler Inhale 2 puffs into the lungs every 6 (six) hours as needed for wheezing or shortness of breath. 09/12/21   Nita Sickle, MD  diclofenac (CATAFLAM) 50 MG tablet Take 1 tablet (50 mg total) by mouth 2 (two) times daily. 08/24/21   Jerrol Banana, MD  ondansetron (ZOFRAN ODT) 4 MG disintegrating tablet Take 1 tablet (4 mg total) by mouth every 8 (eight) hours as needed for nausea or vomiting. 08/02/21   Merwyn Katos, MD    Allergies Patient has no known allergies.  Family History  Problem Relation Age of Onset   Asthma Sister    Asthma Brother    Diabetes Maternal Uncle    Lung cancer Maternal Uncle    Heart disease Maternal Grandmother    Lung disease Maternal Grandmother    Stomach cancer Maternal Grandfather    Heart attack Paternal Grandmother     Social History Social History   Tobacco Use   Smoking status: Never   Smokeless tobacco: Never  Vaping Use   Vaping Use: Former  Substance Use Topics   Alcohol use: Never   Drug use: Never    Review of Systems  Constitutional: No fever/chills Eyes: No visual changes. ENT: No sore throat. Cardiovascular: Denies chest pain. Respiratory: Denies shortness of breath. Gastrointestinal: No abdominal pain.  No nausea, no vomiting.  No diarrhea.  No constipation. Genitourinary: Negative for dysuria. Musculoskeletal: Negative for back pain. Positive for traumatic left shoulder and left foot pain. Skin: Negative for rash. Neurological: Negative for headaches, focal weakness or numbness.  ____________________________________________   PHYSICAL EXAM:  VITAL SIGNS: Vitals:   10/17/21 1330 10/17/21 1659  BP: 131/86   Pulse: (!) 127    Resp: 20   Temp: (!) 101.5 F (38.6 C) 99.1 F (37.3 C)  SpO2: 97%      Constitutional: Alert and oriented. Well appearing and in no acute distress. Eyes: Conjunctivae are normal. PERRL. EOMI. Head: Atraumatic. Nose: No congestion/rhinnorhea. Mouth/Throat: Mucous membranes are moist.  Oropharynx non-erythematous. Neck: No stridor. No cervical spine tenderness to palpation. Cardiovascular: Tachycardic rate, regular rhythm. Grossly normal heart sounds.  Good peripheral circulation. Respiratory: Normal respiratory effort.  No retractions. Lungs CTAB. Gastrointestinal: Soft , nondistended, nontender to palpation. No CVA tenderness. Musculoskeletal: No lower extremity tenderness nor edema.  No joint effusions. Mild tenderness to the anterior aspect of the left shoulder just lateral to her clavicle.  No bony step-offs or signs of external trauma, bruising or skin changes.  Full active and passive ROM, though has some discomfort with extreme abduction. Minimal tenderness to the dorsum of the left foot without overlying skin changes or signs of trauma. Palpation of all 4 extremities otherwise without evidence of deformity or signs of trauma. Neurologic:  Normal speech and language. No gross focal neurologic deficits are appreciated. No gait instability noted. Skin:  Skin is warm, dry and intact. No rash noted. Psychiatric: Mood and affect are normal. Speech and behavior are normal. ____________________________________________   LABS (all labs ordered are listed, but only abnormal results are displayed)  Labs Reviewed  URINALYSIS, COMPLETE (UACMP) WITH MICROSCOPIC - Abnormal; Notable for the following components:      Result Value   Color, Urine YELLOW (*)    APPearance CLEAR (*)    Hgb urine dipstick LARGE (*)    Bacteria, UA RARE (*)    All other components within normal limits  CBC WITH DIFFERENTIAL/PLATELET - Abnormal; Notable for the following components:   Hemoglobin 7.3 (*)     HCT 25.4 (*)    MCV 62.4 (*)    MCH 17.9 (*)    MCHC 28.7 (*)    RDW 17.9 (*)    All other components within normal limits  COMPREHENSIVE METABOLIC PANEL - Abnormal; Notable for the following components:   Potassium 2.9 (*)    BUN 5 (*)    Calcium 8.4 (*)    All other components within normal limits  RESP PANEL BY RT-PCR (FLU A&B, COVID) ARPGX2  LACTIC ACID, PLASMA  LACTIC ACID, PLASMA  PROCALCITONIN  PROCALCITONIN  POC URINE PREG, ED   ____________________________________________  12 Lead EKG  Sinus tachycardia rate of 106 bpm.  Normal axis and intervals.  No evidence of acute ischemia. ____________________________________________  RADIOLOGY  ED MD interpretation:  CXR reviewed by me without evidence of acute cardiopulmonary pathology. Plain films of the left shoulder and left foot reviewed by me without evidence of fracture or dislocation  Official radiology report(s): DG Chest 2 View  Result Date: 10/17/2021 CLINICAL DATA:  fever, cough. eval infiltrate EXAM: CHEST - 2 VIEW COMPARISON:  Chest radiograph 09/12/2021 FINDINGS: Unchanged cardiomediastinal silhouette. There is no focal airspace disease.  There is no large pleural effusion. No visible pneumothorax. There is no acute osseous abnormality. IMPRESSION: No evidence of acute cardiopulmonary disease. Electronically Signed   By: Caprice Renshaw M.D.   On: 10/17/2021 16:19   DG Shoulder Left  Result Date: 10/17/2021 CLINICAL DATA:  Fall, shoulder pain EXAM: LEFT SHOULDER - 2+ VIEW COMPARISON:  None. FINDINGS: There is no evidence of fracture or dislocation. There is no evidence of arthropathy or other focal bone abnormality. Soft tissues are unremarkable. IMPRESSION: No fracture or dislocation of the left shoulder. Joint spaces are preserved. Electronically Signed   By: Jearld Lesch M.D.   On: 10/17/2021 15:04   DG Foot Complete Left  Result Date: 10/17/2021 CLINICAL DATA:  Left foot pain after fall. EXAM: LEFT FOOT -  COMPLETE 3+ VIEW COMPARISON:  None. FINDINGS: There is no evidence of fracture or dislocation. There is no evidence of arthropathy or other focal bone abnormality. Soft tissues are unremarkable. IMPRESSION: Negative. Electronically Signed   By: Lupita Raider M.D.   On: 10/17/2021 15:05    ____________________________________________   PROCEDURES and INTERVENTIONS  Procedure(s) performed (including Critical Care):  Procedures  Medications  acetaminophen (TYLENOL) tablet 650 mg (650 mg Oral Given 10/17/21 1336)  lactated ringers bolus 1,000 mL (0 mLs Intravenous Stopped 10/17/21 1739)  ketorolac (TORADOL) 30 MG/ML injection 15 mg (15 mg Intravenous Given 10/17/21 1659)    ____________________________________________   MDM / ED COURSE   30 year old female presents to the ED after a minor workplace fall, without evidence of significant injury, found incidentally to be febrile and tachycardic of uncertain etiology possibly a viral syndrome, and ultimately amenable to outpatient management.  She presents with a fever, which is a surprise to her, and tachycardic in a sinus tachycardia.  Remains hemodynamically stable without evidence of shock, hypoxia.  She looks clinically well and really has no symptoms or complaints beyond soreness to her left shoulder from the fall.  X-ray without evidence of fracture or dislocation.  No evidence of PTX.  Similarly, plain film of the left foot is reassuring.  COVID and influenza swabs are negative, so I discussed with the patient to pursue blood work, she is agreeable.  Microcytic anemia is noted, no evidence of significant affective pathology such as sepsis or signs of bacteremia.  Suspect viral syndrome.  I provided referral information to hematology and we discussed return precautions for the ED.  Patient suitable for outpatient management.  Sling placed in the left shoulder prior to discharge.  Clinical Course as of 10/17/21 1743  Tue Oct 17, 2021  1619  Reassessed.  Continues to feel fine.  We discussed negative COVID and influenza swabbing.  We discussed my concerns with her fever and tachycardia and uncertain etiologies of these vital signs derangements.  Recommended blood work and she is agreeable. [DS]  1743 Reassessed.  Continues to feel well.  We discussed work-up without evidence of significant acute infectious pathology.  We discussed possible etiologies of her fever and symptoms.  We discussed her anemia.  She reports that she has been told that she is anemic and has had to have transfusions in the past.  We discussed following up with hematology and return precautions for the ED. [DS]    Clinical Course User Index [DS] Delton Prairie, MD    ____________________________________________   FINAL CLINICAL IMPRESSION(S) / ED DIAGNOSES  Final diagnoses:  Fever, unspecified fever cause  Fall, initial encounter  Microcytic anemia     ED Discharge Orders  None        Delton Prairie   Note:  This document was prepared using Conservation officer, historic buildings and may include unintentional dictation errors.    Delton Prairie, MD 10/17/21 1745

## 2021-10-20 ENCOUNTER — Emergency Department: Payer: Medicaid Other

## 2021-10-20 ENCOUNTER — Other Ambulatory Visit: Payer: Self-pay

## 2021-10-20 ENCOUNTER — Emergency Department
Admission: EM | Admit: 2021-10-20 | Discharge: 2021-10-20 | Disposition: A | Discharge status: Home / Self Care | Payer: Medicaid Other | Attending: Emergency Medicine | Admitting: Emergency Medicine

## 2021-10-20 DIAGNOSIS — J45909 Unspecified asthma, uncomplicated: Secondary | ICD-10-CM | POA: Insufficient documentation

## 2021-10-20 DIAGNOSIS — D649 Anemia, unspecified: Secondary | ICD-10-CM | POA: Diagnosis not present

## 2021-10-20 DIAGNOSIS — J209 Acute bronchitis, unspecified: Secondary | ICD-10-CM | POA: Diagnosis not present

## 2021-10-20 DIAGNOSIS — R059 Cough, unspecified: Secondary | ICD-10-CM | POA: Diagnosis present

## 2021-10-20 LAB — CBC WITH DIFFERENTIAL/PLATELET
Abs Immature Granulocytes: 0.01 10*3/uL (ref 0.00–0.07)
Basophils Absolute: 0 10*3/uL (ref 0.0–0.1)
Basophils Relative: 1 %
Eosinophils Absolute: 0.2 10*3/uL (ref 0.0–0.5)
Eosinophils Relative: 5 %
HCT: 30.9 % — ABNORMAL LOW (ref 36.0–46.0)
Hemoglobin: 8.7 g/dL — ABNORMAL LOW (ref 12.0–15.0)
Immature Granulocytes: 0 %
Lymphocytes Relative: 25 %
Lymphs Abs: 0.9 10*3/uL (ref 0.7–4.0)
MCH: 17.7 pg — ABNORMAL LOW (ref 26.0–34.0)
MCHC: 28.2 g/dL — ABNORMAL LOW (ref 30.0–36.0)
MCV: 62.9 fL — ABNORMAL LOW (ref 80.0–100.0)
Monocytes Absolute: 0.3 10*3/uL (ref 0.1–1.0)
Monocytes Relative: 7 %
Neutro Abs: 2.3 10*3/uL (ref 1.7–7.7)
Neutrophils Relative %: 62 %
Platelets: 346 10*3/uL (ref 150–400)
RBC: 4.91 MIL/uL (ref 3.87–5.11)
RDW: 18.3 % — ABNORMAL HIGH (ref 11.5–15.5)
WBC: 3.7 10*3/uL — ABNORMAL LOW (ref 4.0–10.5)
nRBC: 0 % (ref 0.0–0.2)

## 2021-10-20 LAB — URINALYSIS, ROUTINE W REFLEX MICROSCOPIC
Bacteria, UA: NONE SEEN
Bilirubin Urine: NEGATIVE
Glucose, UA: NEGATIVE mg/dL
Ketones, ur: 5 mg/dL — AB
Leukocytes,Ua: NEGATIVE
Nitrite: NEGATIVE
Protein, ur: NEGATIVE mg/dL
RBC / HPF: >50 RBC/hpf — ABNORMAL HIGH (ref 0–5)
Specific Gravity, Urine: 1.017 (ref 1.005–1.030)
pH: 7 (ref 5.0–8.0)

## 2021-10-20 LAB — COMPREHENSIVE METABOLIC PANEL
ALT: 15 U/L (ref 0–44)
AST: 31 U/L (ref 15–41)
Albumin: 4.2 g/dL (ref 3.5–5.0)
Alkaline Phosphatase: 69 U/L (ref 38–126)
Anion gap: 7 (ref 5–15)
BUN: <5 mg/dL — ABNORMAL LOW (ref 6–20)
CO2: 24 mmol/L (ref 22–32)
Calcium: 9 mg/dL (ref 8.9–10.3)
Chloride: 107 mmol/L (ref 98–111)
Creatinine, Ser: 0.77 mg/dL (ref 0.44–1.00)
GFR, Estimated: >60 mL/min (ref >60)
Glucose, Bld: 103 mg/dL — ABNORMAL HIGH (ref 70–99)
Potassium: 4 mmol/L (ref 3.5–5.1)
Sodium: 138 mmol/L (ref 135–145)
Total Bilirubin: 0.7 mg/dL (ref 0.3–1.2)
Total Protein: 7.9 g/dL (ref 6.5–8.1)

## 2021-10-20 LAB — PROCALCITONIN: Procalcitonin: <0.1 ng/mL

## 2021-10-20 MED ORDER — ACETAMINOPHEN 500 MG PO TABS
1000.0000 mg | ORAL_TABLET | ORAL | Status: AC
  Administered 2021-10-20: 1000 mg via ORAL
  Filled 2021-10-20: qty 2

## 2021-10-20 MED ORDER — ONDANSETRON HCL 4 MG/2ML IJ SOLN
4.0000 mg | INTRAMUSCULAR | Status: AC
  Administered 2021-10-20: 4 mg via INTRAVENOUS
  Filled 2021-10-20: qty 2

## 2021-10-20 MED ORDER — SODIUM CHLORIDE 0.9 % IV BOLUS
1000.0000 mL | Freq: Once | INTRAVENOUS | Status: AC
  Administered 2021-10-20: 1000 mL via INTRAVENOUS

## 2021-10-20 MED ORDER — ONDANSETRON 4 MG PO TBDP: 4.0000 mg | ORAL_TABLET | Freq: Four times a day (QID) | ORAL | 0 refills | Status: DC | PRN

## 2021-10-20 MED ORDER — IBUPROFEN 600 MG PO TABS
600.0000 mg | ORAL_TABLET | ORAL | Status: AC
  Administered 2021-10-20: 600 mg via ORAL
  Filled 2021-10-20: qty 1

## 2021-10-20 MED ORDER — PREDNISONE 50 MG PO TABS: 50.0000 mg | ORAL_TABLET | Freq: Every day | ORAL | 0 refills | Status: DC

## 2021-10-20 MED ORDER — PREDNISONE 20 MG PO TABS
40.0000 mg | ORAL_TABLET | Freq: Once | ORAL | Status: AC
  Administered 2021-10-20: 40 mg via ORAL
  Filled 2021-10-20: qty 2

## 2021-10-20 NOTE — ED Provider Notes (Signed)
[facility] Emergency Department Provider Note   ____________________________________________   Event Date/Time   First MD Initiated Contact with Patient 10/20/21 1604     (approximate)  I have reviewed the triage vital signs and the nursing notes.   HISTORY  Chief Complaint No chief complaint on file.    HPI Vernesha Bevard is a 30 y.o. female reports she has a history of chronic asthma as well as anemia  About 3 days ago developed cough cold body aches.  She was seen in the ER and was told it was probably a virus.  Her symptoms have persisted, she reports that she has done a couple of COVID tests in the interim and those were negative.  She had negative COVID and flu test  She continues to have low-grade fevers cough body aches.  She had pain in her left shoulder area a couple days ago but that has resolved.  Denies any further joint aches or pains no tick bites no insect bites no rashes.  Very mild headache off and on.  No pain or burning with urination has vomited a couple of times over the last couple days but no abdominal pain.  Slight decrease in appetite not eating and drinking as much.  Has had to use her inhaler a couple times over the last couple nights that she wheezes some in the evenings.    Past Medical History:  Diagnosis Date   Asthma     Patient Active Problem List   Diagnosis Date Noted   Lower leg mass, left 08/24/2021   Strain of insertion of tendon of hamstring muscle 08/24/2021   Pes anserinus bursitis of left knee 08/24/2021   Acute gallstone pancreatitis 03/29/2021   Gallstone pancreatitis 03/27/2021    Past Surgical History:  Procedure Laterality Date   CHOLECYSTECTOMY  03/2021   LYMPH NODE BIOPSY Bilateral 2016    Prior to Admission medications   Medication Sig Start Date End Date Taking? Authorizing Provider  ondansetron (ZOFRAN-ODT) 4 MG disintegrating tablet Take 1 tablet (4 mg total) by mouth every 6  (six) hours as needed for nausea or vomiting. 10/20/21  Yes Sharyn Creamer, MD  predniSONE (DELTASONE) 50 MG tablet Take 1 tablet (50 mg total) by mouth daily with breakfast. 10/20/21  Yes Sharyn Creamer, MD  acetaminophen (TYLENOL) 500 MG tablet Take 500 mg by mouth every 6 (six) hours as needed for moderate pain, mild pain, fever or headache.    [provider]  albuterol (VENTOLIN HFA) 108 (90 Base) MCG/ACT inhaler Inhale 2 puffs into the lungs every 6 (six) hours as needed for wheezing or shortness of breath. 09/12/21   Nita Sickle, MD  diclofenac (CATAFLAM) 50 MG tablet Take 1 tablet (50 mg total) by mouth 2 (two) times daily. 08/24/21   Jerrol Banana, MD    Allergies Patient has no known allergies.  Family History  Problem Relation Age of Onset   Asthma Sister    Asthma Brother    Diabetes Maternal Uncle    Lung cancer Maternal Uncle    Heart disease Maternal Grandmother    Lung disease Maternal Grandmother    Stomach cancer Maternal Grandfather    Heart attack Paternal Grandmother     Social History Social History   Tobacco Use   Smoking status: Never   Smokeless tobacco: Never  Vaping Use   Vaping Use: Former  Substance Use Topics   Alcohol use: Never   Drug use: Never  Review of Systems Constitutional: Some fevers low-grade chills first fever was about 48 hours ago Eyes: No visual changes. ENT: No sore throat. Cardiovascular: Denies chest pain except an achy feeling especially when she has a dry cough. Respiratory: Denies shortness of breath.  Dry cough for a couple of days Gastrointestinal: No abdominal pain.  Vomited a couple times nonbloody. Genitourinary: Negative for dysuria.  Denies heavy periods or bleeding.  Not pregnant Musculoskeletal: Negative for back pain.  Some joint aches Skin: Negative for rash. Neurological: Negative for areas of focal weakness or numbness.    ____________________________________________   PHYSICAL  EXAM:  VITAL SIGNS: ED Triage Vitals  Enc Vitals Group     BP 10/20/21 1305 115/75     Pulse Rate 10/20/21 1305 (!) 122     Resp 10/20/21 1305 16     Temp 10/20/21 1305 99.7 F (37.6 C)     Temp Source 10/20/21 1305 Oral     SpO2 10/20/21 1305 100 %     Weight 10/20/21 1307 115 lb (52.2 kg)     Height 10/20/21 1307 5\' 3"  (1.6 m)     Head Circumference --      Peak Flow --      Pain Score 10/20/21 1305 10     Pain Loc --      Pain Edu? --      Excl. in GC? --     Constitutional: Alert and oriented. Well appearing and in no acute distress.  Pleasant conversant without distress. Eyes: Conjunctivae are normal. Head: Atraumatic. Nose: No congestion/rhinnorhea. Mouth/Throat: Mucous membranes are moist. Neck: No stridor.  Cardiovascular: Just slightly tachycardic rate, regular rhythm. Grossly normal heart sounds.  Good peripheral circulation. Respiratory: Normal respiratory effort.  No retractions. Lungs CTAB. Gastrointestinal: Soft and nontender. No distention. Musculoskeletal: No lower extremity tenderness nor edema. Neurologic:  Normal speech and language. No gross focal neurologic deficits are appreciated.  Skin:  Skin is warm, dry and intact. No rash noted. Psychiatric: Mood and affect are normal. Speech and behavior are normal.  ____________________________________________   LABS (all labs ordered are listed, but only abnormal results are displayed)  Labs Reviewed  CBC WITH DIFFERENTIAL/PLATELET - Abnormal; Notable for the following components:      Result Value   WBC 3.7 (*)    Hemoglobin 8.7 (*)    HCT 30.9 (*)    MCV 62.9 (*)    MCH 17.7 (*)    MCHC 28.2 (*)    RDW 18.3 (*)    All other components within normal limits  COMPREHENSIVE METABOLIC PANEL - Abnormal; Notable for the following components:   Glucose, Bld 103 (*)    BUN <5 (*)    All other components within normal limits  URINALYSIS, ROUTINE W REFLEX MICROSCOPIC - Abnormal; Notable for the following  components:   Color, Urine YELLOW (*)    APPearance HAZY (*)    Hgb urine dipstick MODERATE (*)    Ketones, ur 5 (*)    RBC / HPF >50 (*)    All other components within normal limits  PROCALCITONIN  ____________________________________________  RADIOLOGY  Personally viewed the patient's x-ray appears normal ____________________________________________   PROCEDURES  Procedure(s) performed: None  Procedures  Critical Care performed: No  ____________________________________________   INITIAL IMPRESSION / ASSESSMENT AND PLAN / ED COURSE  Pertinent labs & imaging results that were available during my care of the patient were reviewed by me and considered in my medical decision making (see chart for  details).   Patient reports ongoing symptoms of body aches fatigue fever dry nonproductive cough.  Chest x-ray clear.  Reports slight achiness in the chest.  No clinical symptoms symptomatology mild headache low-grade fevers cough seem to be fitting of most likely a viral illness.  Her clinical exam does not show evidence of acute bacterial infection.  Denies abdominal pain or urinary symptoms some nausea accomplice of vomiting last 48 hours.  She has slightly tachycardic, she had a previous pretty significant work-up when she is in the ER 2 days ago that seem to be indicative of likely viral illness as well.  No think there initially be benefit to repeat COVID or viral testing.  I will repeat her procalcitonin and if this is elevating would consider possibly antibiotic treatment, but if remains low would doubt clinically that there is active bacterial infection would strongly favor viral especially given her mild decreased white count was suspect as well as viral suppression  Procalcitonin low  Provide antiemetic rehydrate, will start on prednisone for reports that she is having some wheezing and use of albuterol in the evenings.  At this point in the ER she is not actively wheezing and  does not show respiratory distress  Clinical Course as of 10/20/21 1937  Fri Oct 20, 2021  1736 Procalcitonin remains less than 0.1, making pneumonia or acute bacterial infection especially involving pulmonary tract unlikely. [MQ]  [id number] Patient resting comfortably at this time in no distress.  States no acute concerns, alert oriented well-appearing laying on her side utilizing her phone.  No distress.  Completing IV fluids, discussed with the patient treatment recommendations, return precautions, and plan to discharge with prednisone and nausea medicine.  Patient agreeable with this plan [MQ]  1834 Regarding anemia, patient reports [place]  She has been told and continues on iron supplementation.  I did discuss with her and she reports she was told on her last visit to follow-up with a physician, I have recommended and provided her information to set up a follow-up with hematology [MQ]    Clinical Course User Index [MQ] Delman Kitten, MD   ----------------------------------------- 7:33 PM on 10/20/2021 ----------------------------------------- Resting comfortably, repeat vitals obtained by nurse, normal blood pressure heart rate just minimally elevated at 103.  I think at this point the patient appropriate for discharge with treatment with prednisone, careful return precautions as previously discussed  Return precautions and treatment recommendations and follow-up discussed with the patient who is agreeable with the plan.  Vitals:   10/20/21 1305 10/20/21 1932  BP: 115/75 111/68  Pulse: (!) 122 (!) 103  Resp: 16 20  Temp: 99.7 F (37.6 C)   SpO2: 100% 100%    ____________________________________________   FINAL CLINICAL IMPRESSION(S) / ED DIAGNOSES  Final diagnoses:  Anemia, unspecified type  Acute bronchitis, unspecified organism        Note:  This document was prepared using Dragon voice recognition software and may include unintentional  dictation errors       Delman Kitten, MD 10/20/21 1937

## 2021-10-20 NOTE — ED Notes (Signed)
Pt endorsing headache "I dont feel good"

## 2021-10-20 NOTE — ED Triage Notes (Signed)
Pt c/o fever with body aches, cough and BL rib pain with deep breathing and cough with N/V, states she was seen here for a fall 11/29 and had a fever then , was neg here for covid/flu and tested neg again at [facility] yesterday. Denies any painful urination, denies abd pain.Katie Kidd

## 2021-10-22 LAB — POC URINE PREG, ED: Preg Test, Ur: NEGATIVE

## 2021-10-23 ENCOUNTER — Telehealth: Payer: Medicaid Other | Admitting: Physician Assistant

## 2021-10-23 DIAGNOSIS — B029 Zoster without complications: Secondary | ICD-10-CM

## 2021-10-23 MED ORDER — LIDOCAINE 5 % EX OINT: 1.0000 "application " | TOPICAL_OINTMENT | CUTANEOUS | 0 refills | Status: DC | PRN

## 2021-10-23 MED ORDER — VALACYCLOVIR HCL 1 G PO TABS: 1000.0000 mg | ORAL_TABLET | Freq: Three times a day (TID) | ORAL | 0 refills | Status: AC

## 2021-10-23 NOTE — Progress Notes (Signed)
Virtual Visit Consent   [name], you are scheduled for a virtual visit with a [facility] provider today.     Just as with appointments in the office, your consent must be obtained to participate.  Your consent will be active for this visit and any virtual visit you may have with one of our providers in the next 365 days.     If you have a MyChart account, a copy of this consent can be sent to you electronically.  All virtual visits are billed to your insurance company just like a traditional visit in the office.    As this is a virtual visit, video technology does not allow for your provider to perform a traditional examination.  This may limit your provider's ability to fully assess your condition.  If your provider identifies any concerns that need to be evaluated in person or the need to arrange testing (such as labs, EKG, etc.), we will make arrangements to do so.     Although advances in technology are sophisticated, we cannot ensure that it will always work on either your end or our end.  If the connection with a video visit is poor, the visit may have to be switched to a telephone visit.  With either a video or telephone visit, we are not always able to ensure that we have a secure connection.     I need to obtain your verbal consent now.   Are you willing to proceed with your visit today?    [name] has provided verbal consent on 10/23/2021 for a virtual visit (video or telephone).   Margaretann Loveless, PA-C   Date: 10/23/2021 3:11 PM   Virtual Visit via Video Note   I, Margaretann Loveless, connected with  Katie Kidd  ([id number], [birth year]) on 10/23/21 at  2:50 PM EST by a video-enabled telemedicine application and verified that I am speaking with the correct person using two identifiers.  Location: Patient: Virtual Visit Location Patient: Home Provider: Virtual Visit Location Provider: Home Office   I discussed the limitations of evaluation and  management by telemedicine and the availability of in person appointments. The patient expressed understanding and agreed to proceed.    History of Present Illness: Katie Kidd is a 30 y.o. who identifies as a female who was assigned female at birth, and is being seen today for breakout on face. She has had a couple ER visits on 11/29 and 12/2. She has had a few things going on, including a fall of a ladder at work. While in ER was found to febrile with tachycardia. Suspected viral infection with asthma exacerbations. She has been placed on 2 higher doses of prednisone. She reports she was given some medication in the ER via IV and a breathing treatment on 10/20/21. Then the next day she woke up with some bumps and blisters on her right side of the lip that has spread to the nose and now some bumps have started to show on the right cheek. She has had chicken pox as a child.    Problems:  Patient Active Problem List   Diagnosis Date Noted   Lower leg mass, left 08/24/2021   Strain of insertion of tendon of hamstring muscle 08/24/2021   Pes anserinus bursitis of left knee 08/24/2021   Acute gallstone pancreatitis 03/29/2021   Gallstone pancreatitis 03/27/2021    Allergies: No Known Allergies Medications:  Current Outpatient Medications:    lidocaine (XYLOCAINE) 5 % ointment, Apply 1  application topically as needed., Disp: 35.44 g, Rfl: 0   valACYclovir (VALTREX) 1000 MG tablet, Take 1 tablet (1,000 mg total) by mouth 3 (three) times daily for 7 days., Disp: 21 tablet, Rfl: 0   acetaminophen (TYLENOL) 500 MG tablet, Take 500 mg by mouth every 6 (six) hours as needed for moderate pain, mild pain, fever or headache., Disp: , Rfl:    albuterol (VENTOLIN HFA) 108 (90 Base) MCG/ACT inhaler, Inhale 2 puffs into the lungs every 6 (six) hours as needed for wheezing or shortness of breath., Disp: 8 g, Rfl: 2   diclofenac (CATAFLAM) 50 MG tablet, Take 1 tablet (50 mg total) by mouth 2 (two) times  daily., Disp: 60 tablet, Rfl: 0   ondansetron (ZOFRAN-ODT) 4 MG disintegrating tablet, Take 1 tablet (4 mg total) by mouth every 6 (six) hours as needed for nausea or vomiting., Disp: 20 tablet, Rfl: 0   predniSONE (DELTASONE) 50 MG tablet, Take 1 tablet (50 mg total) by mouth daily with breakfast., Disp: 4 tablet, Rfl: 0  Observations/Objective: Patient is well-developed, well-nourished in no acute distress.  Resting comfortably at home.  Head is normocephalic, atraumatic.  No labored breathing.  Speech is clear and coherent with logical content.  Patient is alert and oriented at baseline.    Assessment and Plan: 1. Herpes zoster without complication - valACYclovir (VALTREX) 1000 MG tablet; Take 1 tablet (1,000 mg total) by mouth 3 (three) times daily for 7 days.  Dispense: 21 tablet; Refill: 0 - lidocaine (XYLOCAINE) 5 % ointment; Apply 1 application topically as needed.  Dispense: 35.44 g; Refill: 0  - Suspected shingles outbreak - Valtrex prescribed - Topical lidocaine for pain - Push fluids - Rest - Seek in person evaluation if not improving or worsening  Follow Up Instructions: I discussed the assessment and treatment plan with the patient. The patient was provided an opportunity to ask questions and all were answered. The patient agreed with the plan and demonstrated an understanding of the instructions.  A copy of instructions were sent to the patient via MyChart unless otherwise noted below.    The patient was advised to call back or seek an in-person evaluation if the symptoms worsen or if the condition fails to improve as anticipated.  Time:  I spent 12 minutes with the patient via telehealth technology discussing the above problems/concerns.    Margaretann Loveless, PA-C

## 2021-10-23 NOTE — Patient Instructions (Signed)
Affie Bonfield, thank you for joining Margaretann Loveless, PA-C for today's virtual visit.  While this provider is not your primary care provider (PCP), if your PCP is located in our provider database this encounter information will be shared with them immediately following your visit.  Consent: (Patient) Katie Kidd provided verbal consent for this virtual visit at the beginning of the encounter.  Current Medications:  Current Outpatient Medications:    lidocaine (XYLOCAINE) 5 % ointment, Apply 1 application topically as needed., Disp: 35.44 g, Rfl: 0   valACYclovir (VALTREX) 1000 MG tablet, Take 1 tablet (1,000 mg total) by mouth 3 (three) times daily for 7 days., Disp: 21 tablet, Rfl: 0   acetaminophen (TYLENOL) 500 MG tablet, Take 500 mg by mouth every 6 (six) hours as needed for moderate pain, mild pain, fever or headache., Disp: , Rfl:    albuterol (VENTOLIN HFA) 108 (90 Base) MCG/ACT inhaler, Inhale 2 puffs into the lungs every 6 (six) hours as needed for wheezing or shortness of breath., Disp: 8 g, Rfl: 2   diclofenac (CATAFLAM) 50 MG tablet, Take 1 tablet (50 mg total) by mouth 2 (two) times daily., Disp: 60 tablet, Rfl: 0   ondansetron (ZOFRAN-ODT) 4 MG disintegrating tablet, Take 1 tablet (4 mg total) by mouth every 6 (six) hours as needed for nausea or vomiting., Disp: 20 tablet, Rfl: 0   predniSONE (DELTASONE) 50 MG tablet, Take 1 tablet (50 mg total) by mouth daily with breakfast., Disp: 4 tablet, Rfl: 0   Medications ordered in this encounter:  Meds ordered this encounter  Medications   valACYclovir (VALTREX) 1000 MG tablet    Sig: Take 1 tablet (1,000 mg total) by mouth 3 (three) times daily for 7 days.    Dispense:  21 tablet    Refill:  0    Order Specific Question:   Supervising Provider    Answer:   MILLER, BRIAN [[id number]]   lidocaine (XYLOCAINE) 5 % ointment    Sig: Apply 1 application topically as needed.    Dispense:  35.44 g    Refill:  0    Order  Specific Question:   Supervising Provider    Answer:   Hyacinth Meeker, BRIAN [[id number]]     *If you need refills on other medications prior to your next appointment, please contact your pharmacy*  Follow-Up: Call back or seek an in-person evaluation if the symptoms worsen or if the condition fails to improve as anticipated.  Other Instructions Shingles Shingles, which is also known as herpes zoster, is an infection that causes a painful skin rash and fluid-filled blisters. It is caused by a virus. Shingles only develops in people who: Have had chickenpox. Have been vaccinated against chickenpox. Shingles is rare in this group. What are the causes? Shingles is caused by varicella-zoster virus. This is the same virus that causes chickenpox. After a person is exposed to the virus, it stays in the body in an inactive (dormant) state. Shingles develops if the virus is reactivated. This can happen many years after the first (initial) exposure to the virus. It is not known what causes this virus to be reactivated. What increases the risk? People who have had chickenpox or received the chickenpox vaccine are at risk for shingles. Shingles infection is more common in people who: Are older than 29 years of age. Have a weakened disease-fighting system (immune system), such as people with: HIV (human immunodeficiency virus). AIDS (acquired immunodeficiency syndrome). Cancer. Are taking medicines that weaken the immune  system, such as organ transplant medicines. Are experiencing a lot of stress. What are the signs or symptoms? Early symptoms of this condition include itching, tingling, and pain in an area on your skin. Pain may be described as burning, stabbing, or throbbing. A few days or weeks after early symptoms start, a painful red rash appears. The rash is usually on one side of the body and has a band-like or belt-like pattern. The rash eventually turns into fluid-filled blisters that break open, change  into scabs, and dry up in about 2-3 weeks. At any time during the infection, you may also develop: A fever. Chills. A headache. Nausea. How is this diagnosed? This condition is diagnosed with a skin exam. Skin or fluid samples (a culture) may be taken from the blisters before a diagnosis is made. How is this treated? The rash may last for several weeks. There is not a specific cure for this condition. Your health care provider may prescribe medicines to help you manage pain, recover more quickly, and avoid long-term problems. Medicines may include: Antiviral medicines. Anti-inflammatory medicines. Pain medicines. Anti-itching medicines (antihistamines). If the area involved is on your face, you may be referred to a specialist, such as an eye doctor (ophthalmologist) or an ear, nose, and throat (ENT) doctor (otorhinolaryngologist) to help you avoid eye problems, chronic pain, or disability. Follow these instructions at home: Medicines Take over-the-counter and prescription medicines only as told by your health care provider. Apply an anti-itch cream or numbing cream to the affected area as told by your health care provider. Relieving itching and discomfort  Apply cold, wet cloths (cold compresses) to the area of the rash or blisters as told by your health care provider. Cool baths can be soothing. Try adding baking soda or dry oatmeal to the water to reduce itching. Do not bathe in hot water. Use calamine lotion as recommended by your health care provider. This is an over-the-counter lotion that helps to relieve itchiness. Blister and rash care Keep your rash covered with a loose bandage (dressing). Wear loose-fitting clothing to help ease the pain of material rubbing against the rash. Wash your hands with soap and water for at least 20 seconds before and after you change your dressing. If soap and water are not available, use hand sanitizer. Change your dressing as told by your health care  provider. Keep your rash and blisters clean by washing the area with mild soap and cool water as told by your health care provider. Check your rash every day for signs of infection. Check for: More redness, swelling, or pain. Fluid or blood. Warmth. Pus or a bad smell. Do not scratch your rash or pick at your blisters. To help avoid scratching: Keep your fingernails clean and cut short. Wear gloves or mittens while you sleep, if scratching is a problem. General instructions Rest as told by your health care provider. Wash your hands often with soap and water for at least 20 seconds. If soap and water are not available, use hand sanitizer. Doing this lowers your chance of getting a bacterial skin infection. Before your blisters change into scabs, your shingles infection can cause chickenpox in people who have never had it or have never been vaccinated against it. To prevent this from happening, avoid contact with other people, especially: Babies. Pregnant women. Children who have eczema. Older people who have transplants. People who have chronic illnesses, such as cancer or AIDS. Keep all follow-up visits. This is important. How is this prevented?  Getting vaccinated is the best way to prevent shingles and protect against shingles complications. If you have not been vaccinated, talk with your health care provider about getting the vaccine. Where to find more information Centers for Disease Control and Prevention: [employer].br Contact a health care provider if: Your pain is not relieved with prescribed medicines. Your pain does not get better after the rash heals. You have any of these signs of infection: More redness, swelling, or pain around the rash. Fluid or blood coming from the rash. Warmth coming from your rash. Pus or a bad smell coming from the rash. A fever. Get help right away if: The rash is on your face or nose. You have facial pain, pain around your eye area, or loss of  feeling on one side of your face. You have difficulty seeing. You have ear pain or have ringing in your ear. You have a loss of taste. Your condition gets worse. Summary Shingles, also known as herpes zoster, is an infection that causes a painful skin rash and fluid-filled blisters. This condition is diagnosed with a skin exam. Skin or fluid samples (a culture) may be taken from the blisters. Keep your rash covered with a loose bandage (dressing). Wear loose-fitting clothing to help ease the pain of material rubbing against the rash. Before your blisters change into scabs, your shingles infection can cause chickenpox in people who have never had it or have never been vaccinated against it. This information is not intended to replace advice given to you by your health care provider. Make sure you discuss any questions you have with your health care provider. Document Revised: 10/31/2020 Document Reviewed: 10/31/2020 Elsevier Patient Education  2022 [employer].    If you have been instructed to have an in-person evaluation today at a local Urgent Care facility, please use the link below. It will take you to a list of all of our available [facility], including address, phone number and hours of operation. Please do not delay care.  [facility]  If you or a family member do not have a primary care provider, use the link below to schedule a visit and establish care. When you choose a [facility] primary care physician or advanced practice provider, you gain a long-term partner in health. Find a Primary Care Provider  Learn more about [facility]'s in-office and virtual care options: [facility] Get Care Now

## 2021-10-30 ENCOUNTER — Encounter: Payer: Self-pay | Admitting: *Deleted

## 2021-11-02 ENCOUNTER — Inpatient Hospital Stay: Payer: Medicaid Other

## 2021-11-02 ENCOUNTER — Encounter: Payer: Self-pay | Admitting: Oncology

## 2021-11-02 ENCOUNTER — Other Ambulatory Visit: Payer: Self-pay

## 2021-11-02 ENCOUNTER — Inpatient Hospital Stay: Payer: Medicaid Other | Attending: Oncology | Admitting: Oncology

## 2021-11-02 VITALS — BP 112/75 | HR 101 | Temp 97.0°F | Wt 114.0 lb

## 2021-11-02 DIAGNOSIS — D509 Iron deficiency anemia, unspecified: Secondary | ICD-10-CM

## 2021-11-02 DIAGNOSIS — N920 Excessive and frequent menstruation with regular cycle: Secondary | ICD-10-CM | POA: Insufficient documentation

## 2021-11-02 LAB — IRON AND TIBC
Iron: 23 ug/dL — ABNORMAL LOW (ref 28–170)
Saturation Ratios: 4 % — ABNORMAL LOW (ref 10.4–31.8)
TIBC: 602 ug/dL — ABNORMAL HIGH (ref 250–450)
UIBC: 579 ug/dL

## 2021-11-02 LAB — FERRITIN: Ferritin: 4 ng/mL — ABNORMAL LOW (ref 11–307)

## 2021-11-02 LAB — CBC
HCT: 32.4 % — ABNORMAL LOW (ref 36.0–46.0)
Hemoglobin: 9 g/dL — ABNORMAL LOW (ref 12.0–15.0)
MCH: 17.4 pg — ABNORMAL LOW (ref 26.0–34.0)
MCHC: 27.8 g/dL — ABNORMAL LOW (ref 30.0–36.0)
MCV: 62.7 fL — ABNORMAL LOW (ref 80.0–100.0)
Platelets: 519 10*3/uL — ABNORMAL HIGH (ref 150–400)
RBC: 5.17 MIL/uL — ABNORMAL HIGH (ref 3.87–5.11)
RDW: 19.2 % — ABNORMAL HIGH (ref 11.5–15.5)
WBC: 5.3 10*3/uL (ref 4.0–10.5)
nRBC: 0 % (ref 0.0–0.2)

## 2021-11-02 LAB — LACTATE DEHYDROGENASE: LDH: 154 U/L (ref 98–192)

## 2021-11-02 LAB — DAT, POLYSPECIFIC AHG (ARMC ONLY): Polyspecific AHG test: NEGATIVE

## 2021-11-02 LAB — VITAMIN B12: Vitamin B-12: 568 pg/mL (ref 180–914)

## 2021-11-02 LAB — FOLATE: Folate: 7.2 ng/mL (ref >5.9)

## 2021-11-02 NOTE — Progress Notes (Signed)
[facility]  [phone] [phone]  ID: Katie Kidd OB: 05/19/1991  MR#: [id number]  WUX#:[id number]  Patient Care Team: Jerrol Banana, MD as PCP - General (Family Medicine)  CHIEF COMPLAINT: Iron deficiency anemia  INTERVAL HISTORY: Patient is a 30 year old female with a longstanding history of iron deficiency anemia who recently presented to the emergency room with shortness of breath from asthma exacerbation.  She has chronic weakness and fatigue but otherwise feels well.  She has no neurologic complaints.  She denies any recent fevers or illnesses.  She has a good appetite and denies weight loss.  She denies any further shortness of breath.  She has no chest pain, cough, or hemoptysis.  She has no nausea, vomiting, constipation, or diarrhea.  She has no urinary complaints.  Patient otherwise feels well and offers no further specific complaints today.  REVIEW OF SYSTEMS:   Review of Systems  Constitutional: Negative.  Negative for fever, malaise/fatigue and weight loss.  Respiratory: Negative.  Negative for cough, hemoptysis and shortness of breath.   Cardiovascular: Negative.  Negative for chest pain.  Gastrointestinal: Negative.  Negative for abdominal pain, blood in stool and melena.  Genitourinary: Negative.  Negative for dysuria.  Musculoskeletal: Negative.  Negative for back pain.  Skin: Negative.   Neurological: Negative.  Negative for dizziness, focal weakness, weakness and headaches.  Psychiatric/Behavioral: Negative.  The patient is not nervous/anxious.    As per HPI. Otherwise, a complete review of systems is negative.  PAST MEDICAL HISTORY: Past Medical History:  Diagnosis Date   Anemia    Asthma     PAST SURGICAL HISTORY: Past Surgical History:  Procedure Laterality Date   CHOLECYSTECTOMY  03/2021   LYMPH NODE BIOPSY Bilateral 2016    FAMILY HISTORY: Family History  Problem Relation Age of Onset   Asthma  Sister    Asthma Brother    Diabetes Maternal Uncle    Lung cancer Maternal Uncle    Heart disease Maternal Grandmother    Lung disease Maternal Grandmother    Stomach cancer Maternal Grandfather    Heart attack Paternal Grandmother     ADVANCED DIRECTIVES (Y/N):  N  HEALTH MAINTENANCE: Social History   Tobacco Use   Smoking status: Never   Smokeless tobacco: Never  Vaping Use   Vaping Use: Former  Substance Use Topics   Alcohol use: Never   Drug use: Never     Colonoscopy:  PAP:  Bone density:  Lipid panel:  No Known Allergies  Current Outpatient Medications  Medication Sig Dispense Refill   acetaminophen (TYLENOL) 500 MG tablet Take 500 mg by mouth every 6 (six) hours as needed for moderate pain, mild pain, fever or headache.     albuterol (VENTOLIN HFA) 108 (90 Base) MCG/ACT inhaler Inhale 2 puffs into the lungs every 6 (six) hours as needed for wheezing or shortness of breath. 8 g 2   No current facility-administered medications for this visit.    OBJECTIVE: Vitals:   11/02/21 1506  BP: 112/75  Pulse: (!) 101  Temp: (!) 97 F (36.1 C)  SpO2: 100%     Body mass index is 20.19 kg/m.    ECOG FS:0 - Asymptomatic  General: Well-developed, well-nourished, no acute distress. Eyes: Pink conjunctiva, anicteric sclera. HEENT: Normocephalic, moist mucous membranes. Lungs: No audible wheezing or coughing. Heart: Regular rate and rhythm. Abdomen: Soft, nontender, no obvious distention. Musculoskeletal: No edema, cyanosis, or clubbing. Neuro: Alert, answering all questions appropriately. Cranial nerves grossly  intact. Skin: No rashes or petechiae noted. Psych: Normal affect. Lymphatics: No cervical, calvicular, axillary or inguinal LAD.   LAB RESULTS:  Lab Results  Component Value Date   NA 138 10/20/2021   K 4.0 10/20/2021   CL 107 10/20/2021   CO2 24 10/20/2021   GLUCOSE 103 (H) 10/20/2021   BUN <5 (L) 10/20/2021   CREATININE 0.77 10/20/2021    CALCIUM 9.0 10/20/2021   PROT 7.9 10/20/2021   ALBUMIN 4.2 10/20/2021   AST 31 10/20/2021   ALT 15 10/20/2021   ALKPHOS 69 10/20/2021   BILITOT 0.7 10/20/2021   GFRNONAA >60 10/20/2021    Lab Results  Component Value Date   WBC 5.3 11/02/2021   NEUTROABS 2.3 10/20/2021   HGB 9.0 (L) 11/02/2021   HCT 32.4 (L) 11/02/2021   MCV 62.7 (L) 11/02/2021   PLT 519 (H) 11/02/2021   Lab Results  Component Value Date   IRON 23 (L) 11/02/2021   TIBC 602 (H) 11/02/2021   IRONPCTSAT 4 (L) 11/02/2021   Lab Results  Component Value Date   FERRITIN 4 (L) 11/02/2021     STUDIES: DG Chest 2 View  Result Date: 10/20/2021 CLINICAL DATA:  Fever, cough, body aches.  Rib pain. EXAM: CHEST - 2 VIEW COMPARISON:  None. FINDINGS: The heart size and mediastinal contours are within normal limits. Both lungs are clear. The visualized skeletal structures are unremarkable. IMPRESSION: No active cardiopulmonary disease. Electronically Signed   By: Keane Police D.O.   On: 10/20/2021 13:29   DG Chest 2 View  Result Date: 10/17/2021 CLINICAL DATA:  fever, cough. eval infiltrate EXAM: CHEST - 2 VIEW COMPARISON:  Chest radiograph 09/12/2021 FINDINGS: Unchanged cardiomediastinal silhouette. There is no focal airspace disease. There is no large pleural effusion. No visible pneumothorax. There is no acute osseous abnormality. IMPRESSION: No evidence of acute cardiopulmonary disease. Electronically Signed   By: Maurine Simmering M.D.   On: 10/17/2021 16:19   DG Shoulder Left  Result Date: 10/17/2021 CLINICAL DATA:  Fall, shoulder pain EXAM: LEFT SHOULDER - 2+ VIEW COMPARISON:  None. FINDINGS: There is no evidence of fracture or dislocation. There is no evidence of arthropathy or other focal bone abnormality. Soft tissues are unremarkable. IMPRESSION: No fracture or dislocation of the left shoulder. Joint spaces are preserved. Electronically Signed   By: Delanna Ahmadi M.D.   On: 10/17/2021 15:04   DG Foot Complete  Left  Result Date: 10/17/2021 CLINICAL DATA:  Left foot pain after fall. EXAM: LEFT FOOT - COMPLETE 3+ VIEW COMPARISON:  None. FINDINGS: There is no evidence of fracture or dislocation. There is no evidence of arthropathy or other focal bone abnormality. Soft tissues are unremarkable. IMPRESSION: Negative. Electronically Signed   By: Marijo Conception M.D.   On: 10/17/2021 15:05    ASSESSMENT: Iron deficiency anemia.  PLAN:    1.  Iron deficiency anemia: Likely secondary to heavy menses.  Patient's hemoglobin and iron stores remain significantly reduced.  B12, folate, and hemolysis labs are all negative or within normal limits.  Patient will benefit from IV Venofer.  Return to clinic 5 times over the next 3 weeks to receive 200 mg IV Venofer.  Patient will then return to clinic in 4 months with repeat laboratory work, further evaluation, and consideration of treatment if needed.  2.  Shortness of breath: Resolved.  Continue evaluation and treatment per primary care. 3.  Thrombocytosis: Likely secondary to iron deficiency.  Monitor.  Patient expressed understanding and was  in agreement with this plan. She also understands that She can call clinic at any time with any questions, concerns, or complaints.    Lloyd Huger, MD   11/02/2021 11:09 PM

## 2021-11-03 LAB — HAPTOGLOBIN: Haptoglobin: 172 mg/dL (ref 33–278)

## 2021-11-06 ENCOUNTER — Ambulatory Visit: Payer: Medicaid Other | Admitting: Family Medicine

## 2021-11-07 ENCOUNTER — Other Ambulatory Visit: Payer: Self-pay

## 2021-11-07 ENCOUNTER — Inpatient Hospital Stay: Payer: Medicaid Other

## 2021-11-07 VITALS — BP 110/70 | HR 97

## 2021-11-07 DIAGNOSIS — D509 Iron deficiency anemia, unspecified: Secondary | ICD-10-CM

## 2021-11-07 MED ORDER — SODIUM CHLORIDE 0.9 % IV SOLN: 200.0000 mg | Freq: Once | INTRAVENOUS | Status: DC

## 2021-11-07 MED ORDER — IRON SUCROSE 20 MG/ML IV SOLN
200.0000 mg | Freq: Once | INTRAVENOUS | Status: AC
  Administered 2021-11-07: 14:00:00 200 mg via INTRAVENOUS
  Filled 2021-11-07: qty 10

## 2021-11-07 MED ORDER — SODIUM CHLORIDE 0.9 % IV SOLN
Freq: Once | INTRAVENOUS | Status: AC
Start: 2021-11-07 — End: 2021-11-07
  Filled 2021-11-07: qty 250

## 2021-11-07 NOTE — Patient Instructions (Signed)
[facility]  Discharge Instructions: °Thank you for choosing [facility] to provide your oncology and hematology care.  °If you have a lab appointment with the Cancer Center, please go directly to the Cancer Center and check in at the registration area. ° °Wear comfortable clothing and clothing appropriate for easy access to any Portacath or PICC line.  ° °We strive to give you quality time with your provider. You may need to reschedule your appointment if you arrive late (15 or more minutes).  Arriving late affects you and other patients whose appointments are after yours.  Also, if you miss three or more appointments without notifying the office, you may be dismissed from the clinic at the provider’s discretion.    °  °For prescription refill requests, have your pharmacy contact our office and allow 72 hours for refills to be completed.   ° °Today you received the following : Venofer ° ° °Iron Sucrose Injection °What is this medication? °IRON SUCROSE (EYE ern SOO krose) treats low levels of iron (iron deficiency anemia) in people with kidney disease. Iron is a mineral that plays an important role in making red blood cells, which carry oxygen from your lungs to the rest of your body. °This medicine may be used for other purposes; ask your health care provider or pharmacist if you have questions. °COMMON BRAND NAME(S): Venofer °What should I tell my care team before I take this medication? °They need to know if you have any of these conditions: °Anemia not caused by low iron levels °Heart disease °High levels of iron in the blood °Kidney disease °Liver disease °An unusual or allergic reaction to iron, other medications, foods, dyes, or preservatives °Pregnant or trying to get pregnant °Breast-feeding °How should I use this medication? °This medication is for infusion into a vein. It is given in a hospital or clinic setting. °Talk to your care team about the use of this  medication in children. While this medication may be prescribed for children as young as 2 years for selected conditions, precautions do apply. °Overdosage: If you think you have taken too much of this medicine contact a poison control center or emergency room at once. °NOTE: This medicine is only for you. Do not share this medicine with others. °What if I miss a dose? °It is important not to miss your dose. Call your care team if you are unable to keep an appointment. °What may interact with this medication? °Do not take this medication with any of the following: °Deferoxamine °Dimercaprol °Other iron products °This medication may also interact with the following: °Chloramphenicol °Deferasirox °This list may not describe all possible interactions. Give your health care provider a list of all the medicines, herbs, non-prescription drugs, or dietary supplements you use. Also tell them if you smoke, drink alcohol, or use illegal drugs. Some items may interact with your medicine. °What should I watch for while using this medication? °Visit your care team regularly. Tell your care team if your symptoms do not start to get better or if they get worse. You may need blood work done while you are taking this medication. °You may need to follow a special diet. Talk to your care team. Foods that contain iron include: whole grains/cereals, dried fruits, beans, or peas, leafy green vegetables, and organ meats (liver, kidney). °What side effects may I notice from receiving this medication? °Side effects that you should report to your care team as soon as possible: °Allergic   reactions--skin rash, itching, hives, swelling of the face, lips, tongue, or throat °Low blood pressure--dizziness, feeling faint or lightheaded, blurry vision °Shortness of breath °Side effects that usually do not require medical attention (report to your care team if they continue or are bothersome): °Flushing °Headache °Joint pain °Muscle  pain °Nausea °Pain, redness, or irritation at injection site °This list may not describe all possible side effects. Call your doctor for medical advice about side effects. You may report side effects to FDA at 1-800-FDA-1088. °Where should I keep my medication? °This medication is given in a hospital or clinic and will not be stored at home. °NOTE: This sheet is a summary. It may not cover all possible information. If you have questions about this medicine, talk to your doctor, pharmacist, or health care provider. °© 2022 Elsevier/Gold Standard (2021-03-31 00:00:00) ° °  °To help prevent nausea and vomiting after your treatment, we encourage you to take your nausea medication as directed. ° °BELOW ARE SYMPTOMS THAT SHOULD BE REPORTED IMMEDIATELY: °*FEVER GREATER THAN 100.4 F (38 °C) OR HIGHER °*CHILLS OR SWEATING °*NAUSEA AND VOMITING THAT IS NOT CONTROLLED WITH YOUR NAUSEA MEDICATION °*UNUSUAL SHORTNESS OF BREATH °*UNUSUAL BRUISING OR BLEEDING °*URINARY PROBLEMS (pain or burning when urinating, or frequent urination) °*BOWEL PROBLEMS (unusual diarrhea, constipation, pain near the anus) °TENDERNESS IN MOUTH AND THROAT WITH OR WITHOUT PRESENCE OF ULCERS (sore throat, sores in mouth, or a toothache) °UNUSUAL RASH, SWELLING OR PAIN  °UNUSUAL VAGINAL DISCHARGE OR ITCHING  ° °Items with * indicate a potential emergency and should be followed up as soon as possible or go to the Emergency Department if any problems should occur. ° °Please show the CHEMOTHERAPY ALERT CARD or IMMUNOTHERAPY ALERT CARD at check-in to the Emergency Department and triage nurse. ° °Should you have questions after your visit or need to cancel or reschedule your appointment, please contact [facility]  [phone] and follow the prompts.  Office hours are 8:00 a.m. to 4:30 p.m. Monday - Friday. Please note that voicemails left after 4:00 p.m. may not be returned until the following business day.  We are closed  weekends and major holidays. You have access to a nurse at all times for urgent questions. Please call the main number to the clinic [phone] and follow the prompts. ° °For any non-urgent questions, you may also contact your provider using MyChart. We now offer e-Visits for anyone 18 and older to request care online for non-urgent symptoms. For details visit [email or web address]. °  °Also download the MyChart app! Go to the app store, search "MyChart", open the app, select [facility], and log in with your MyChart username and password. ° °Due to Covid, a mask is required upon entering the hospital/clinic. If you do not have a mask, one will be given to you upon arrival. For doctor visits, patients may have 1 support person aged 18 or older with them. For treatment visits, patients cannot have anyone with them due to current Covid guidelines and our immunocompromised population.  °

## 2021-11-09 ENCOUNTER — Inpatient Hospital Stay: Payer: Medicaid Other

## 2021-11-15 ENCOUNTER — Inpatient Hospital Stay: Payer: Medicaid Other

## 2021-11-15 ENCOUNTER — Other Ambulatory Visit: Payer: Self-pay

## 2021-11-15 VITALS — BP 117/73 | HR 115 | Temp 97.8°F

## 2021-11-15 DIAGNOSIS — D509 Iron deficiency anemia, unspecified: Secondary | ICD-10-CM | POA: Diagnosis not present

## 2021-11-15 MED ORDER — IRON SUCROSE 20 MG/ML IV SOLN
200.0000 mg | Freq: Once | INTRAVENOUS | Status: AC
  Administered 2021-11-15: 15:00:00 200 mg via INTRAVENOUS
  Filled 2021-11-15: qty 10

## 2021-11-15 MED ORDER — SODIUM CHLORIDE 0.9 % IV SOLN
Freq: Once | INTRAVENOUS | Status: AC
  Filled 2021-11-15: qty 250

## 2021-11-15 MED ORDER — SODIUM CHLORIDE 0.9 % IV SOLN: 200.0000 mg | Freq: Once | INTRAVENOUS | Status: DC

## 2021-11-17 ENCOUNTER — Other Ambulatory Visit: Payer: Self-pay

## 2021-11-17 ENCOUNTER — Inpatient Hospital Stay: Payer: Medicaid Other

## 2021-11-17 VITALS — BP 106/68 | HR 88 | Temp 97.3°F

## 2021-11-17 DIAGNOSIS — D509 Iron deficiency anemia, unspecified: Secondary | ICD-10-CM

## 2021-11-17 MED ORDER — SODIUM CHLORIDE 0.9 % IV SOLN
Freq: Once | INTRAVENOUS | Status: AC
  Filled 2021-11-17: qty 250

## 2021-11-17 MED ORDER — SODIUM CHLORIDE 0.9 % IV SOLN: 200.0000 mg | Freq: Once | INTRAVENOUS | Status: DC

## 2021-11-17 MED ORDER — IRON SUCROSE 20 MG/ML IV SOLN
200.0000 mg | Freq: Once | INTRAVENOUS | Status: AC
  Administered 2021-11-17: 15:00:00 200 mg via INTRAVENOUS
  Filled 2021-11-17: qty 10

## 2021-11-19 ENCOUNTER — Encounter: Payer: Self-pay | Admitting: Oncology

## 2021-11-21 ENCOUNTER — Encounter: Payer: Self-pay | Admitting: Oncology

## 2021-11-22 ENCOUNTER — Other Ambulatory Visit: Payer: Self-pay

## 2021-11-22 ENCOUNTER — Inpatient Hospital Stay: Payer: Medicaid Other | Attending: Oncology

## 2021-11-22 VITALS — BP 113/69 | HR 77 | Temp 97.5°F | Resp 18

## 2021-11-22 DIAGNOSIS — N920 Excessive and frequent menstruation with regular cycle: Secondary | ICD-10-CM | POA: Diagnosis not present

## 2021-11-22 DIAGNOSIS — D500 Iron deficiency anemia secondary to blood loss (chronic): Secondary | ICD-10-CM | POA: Insufficient documentation

## 2021-11-22 DIAGNOSIS — D509 Iron deficiency anemia, unspecified: Secondary | ICD-10-CM

## 2021-11-22 MED ORDER — IRON SUCROSE 20 MG/ML IV SOLN
200.0000 mg | Freq: Once | INTRAVENOUS | Status: AC
  Administered 2021-11-22: 200 mg via INTRAVENOUS
  Filled 2021-11-22: qty 10

## 2021-11-22 MED ORDER — SODIUM CHLORIDE 0.9 % IV SOLN
Freq: Once | INTRAVENOUS | Status: AC
  Filled 2021-11-22: qty 250

## 2021-11-22 MED ORDER — SODIUM CHLORIDE 0.9 % IV SOLN: 200.0000 mg | Freq: Once | INTRAVENOUS | Status: DC

## 2021-11-24 ENCOUNTER — Inpatient Hospital Stay: Payer: Medicaid Other

## 2021-11-24 ENCOUNTER — Other Ambulatory Visit: Payer: Self-pay

## 2021-11-24 VITALS — BP 90/69 | HR 78 | Temp 97.9°F | Resp 18

## 2021-11-24 DIAGNOSIS — D509 Iron deficiency anemia, unspecified: Secondary | ICD-10-CM

## 2021-11-24 DIAGNOSIS — D500 Iron deficiency anemia secondary to blood loss (chronic): Secondary | ICD-10-CM | POA: Diagnosis not present

## 2021-11-24 MED ORDER — IRON SUCROSE 20 MG/ML IV SOLN
200.0000 mg | Freq: Once | INTRAVENOUS | Status: AC
  Administered 2021-11-24: 200 mg via INTRAVENOUS
  Filled 2021-11-24: qty 10

## 2021-11-24 MED ORDER — SODIUM CHLORIDE 0.9 % IV SOLN
Freq: Once | INTRAVENOUS | Status: AC
  Filled 2021-11-24: qty 250

## 2021-11-24 MED ORDER — SODIUM CHLORIDE 0.9 % IV SOLN: 200.0000 mg | Freq: Once | INTRAVENOUS | Status: DC

## 2022-02-06 ENCOUNTER — Encounter: Payer: Self-pay | Admitting: Oncology

## 2022-02-28 ENCOUNTER — Inpatient Hospital Stay: Payer: Medicaid Other | Attending: Oncology

## 2022-02-28 ENCOUNTER — Other Ambulatory Visit: Payer: Self-pay

## 2022-02-28 DIAGNOSIS — N920 Excessive and frequent menstruation with regular cycle: Secondary | ICD-10-CM | POA: Diagnosis not present

## 2022-02-28 DIAGNOSIS — D509 Iron deficiency anemia, unspecified: Secondary | ICD-10-CM | POA: Diagnosis not present

## 2022-02-28 LAB — CBC WITH DIFFERENTIAL/PLATELET
Abs Immature Granulocytes: 0 10*3/uL (ref 0.00–0.07)
Basophils Absolute: 0 10*3/uL (ref 0.0–0.1)
Basophils Relative: 0 %
Eosinophils Absolute: 0.4 10*3/uL (ref 0.0–0.5)
Eosinophils Relative: 8 %
HCT: 37 % (ref 36.0–46.0)
Hemoglobin: 12 g/dL (ref 12.0–15.0)
Immature Granulocytes: 0 %
Lymphocytes Relative: 45 %
Lymphs Abs: 2.1 10*3/uL (ref 0.7–4.0)
MCH: 26.7 pg (ref 26.0–34.0)
MCHC: 32.4 g/dL (ref 30.0–36.0)
MCV: 82.4 fL (ref 80.0–100.0)
Monocytes Absolute: 0.3 10*3/uL (ref 0.1–1.0)
Monocytes Relative: 7 %
Neutro Abs: 1.9 10*3/uL (ref 1.7–7.7)
Neutrophils Relative %: 40 %
Platelets: 299 10*3/uL (ref 150–400)
RBC: 4.49 MIL/uL (ref 3.87–5.11)
RDW: 12.8 % (ref 11.5–15.5)
WBC: 4.7 10*3/uL (ref 4.0–10.5)
nRBC: 0 % (ref 0.0–0.2)

## 2022-02-28 LAB — IRON AND TIBC
Iron: 75 ug/dL (ref 28–170)
Saturation Ratios: 24 % (ref 10.4–31.8)
TIBC: 309 ug/dL (ref 250–450)
UIBC: 234 ug/dL

## 2022-02-28 LAB — FERRITIN: Ferritin: 38 ng/mL (ref 11–307)

## 2022-03-01 ENCOUNTER — Encounter: Payer: Self-pay | Admitting: Oncology

## 2022-03-01 ENCOUNTER — Other Ambulatory Visit: Payer: Medicaid Other

## 2022-03-01 ENCOUNTER — Inpatient Hospital Stay (HOSPITAL_BASED_OUTPATIENT_CLINIC_OR_DEPARTMENT_OTHER): Payer: Medicaid Other | Admitting: Oncology

## 2022-03-01 VITALS — BP 99/62 | HR 90 | Temp 96.5°F | Resp 16 | Ht 63.0 in | Wt 117.0 lb

## 2022-03-01 DIAGNOSIS — D509 Iron deficiency anemia, unspecified: Secondary | ICD-10-CM

## 2022-03-01 NOTE — Progress Notes (Signed)
?[facility]  ?[phone] [phone]) [phone] ? ?ID: Katie Kidd OB: 12-25-1990  MR#: [id number]  [id number] ? ?Patient Care Team: ?Montel Culver, MD as PCP - General (Family Medicine) ?Lloyd Huger, MD as Consulting Physician (Hematology and Oncology) ? ?CHIEF COMPLAINT: Iron deficiency anemia ? ?INTERVAL HISTORY: Patient returns to clinic today for repeat laboratory work, further evaluation, consideration of additional IV iron.  She feels significantly improved after receiving treatment several months ago.  She denies any weakness or fatigue today. She has no neurologic complaints.  She denies any recent fevers or illnesses.  She has a good appetite and denies weight loss.  She denies any further shortness of breath.  She has no chest pain, cough, or hemoptysis.  She has no nausea, vomiting, constipation, or diarrhea.  She has no urinary complaints.  Patient offers no specific complaints today. ? ?REVIEW OF SYSTEMS:   ?Review of Systems  ?Constitutional: Negative.  Negative for fever, malaise/fatigue and weight loss.  ?Respiratory: Negative.  Negative for cough, hemoptysis and shortness of breath.   ?Cardiovascular: Negative.  Negative for chest pain.  ?Gastrointestinal: Negative.  Negative for abdominal pain, blood in stool and melena.  ?Genitourinary: Negative.  Negative for dysuria.  ?Musculoskeletal: Negative.  Negative for back pain.  ?Skin: Negative.   ?Neurological: Negative.  Negative for dizziness, focal weakness, weakness and headaches.  ?Psychiatric/Behavioral: Negative.  The patient is not nervous/anxious.   ? ?As per HPI. Otherwise, a complete review of systems is negative. ? ?PAST MEDICAL HISTORY: ?Past Medical History:  ?Diagnosis Date  ? Anemia   ? Asthma   ? ? ?PAST SURGICAL HISTORY: ?Past Surgical History:  ?Procedure Laterality Date  ? CHOLECYSTECTOMY  03/2021  ? LYMPH NODE BIOPSY Bilateral 2016  ? ? ?FAMILY HISTORY: ?Family History  ?Problem  Relation Age of Onset  ? Asthma Sister   ? Asthma Brother   ? Diabetes Maternal Uncle   ? Lung cancer Maternal Uncle   ? Heart disease Maternal Grandmother   ? Lung disease Maternal Grandmother   ? Stomach cancer Maternal Grandfather   ? Heart attack Paternal Grandmother   ? ? ?ADVANCED DIRECTIVES (Y/N):  N ? ?HEALTH MAINTENANCE: ?Social History  ? ?Tobacco Use  ? Smoking status: Never  ? Smokeless tobacco: Never  ?Vaping Use  ? Vaping Use: Former  ?Substance Use Topics  ? Alcohol use: Never  ? Drug use: Never  ? ? ? Colonoscopy: ? PAP: ? Bone density: ? Lipid panel: ? ?No Known Allergies ? ?Current Outpatient Medications  ?Medication Sig Dispense Refill  ? acetaminophen (TYLENOL) 500 MG tablet Take 500 mg by mouth every 6 (six) hours as needed for moderate pain, mild pain, fever or headache.    ? albuterol (VENTOLIN HFA) 108 (90 Base) MCG/ACT inhaler Inhale 2 puffs into the lungs every 6 (six) hours as needed for wheezing or shortness of breath. 8 g 2  ? ?No current facility-administered medications for this visit.  ? ? ?OBJECTIVE: ?Vitals:  ? 03/01/22 1410  ?BP: 99/62  ?Pulse: 90  ?Resp: 16  ?Temp: (!) 96.5 ?F (35.8 ?C)  ?SpO2: 100%  ?   Body mass index is 20.73 kg/m?Marland Kitchen    ECOG FS:0 - Asymptomatic ? ?General: Well-developed, well-nourished, no acute distress. ?Eyes: Pink conjunctiva, anicteric sclera. ?HEENT: Normocephalic, moist mucous membranes. ?Lungs: No audible wheezing or coughing. ?Heart: Regular rate and rhythm. ?Abdomen: Soft, nontender, no obvious distention. ?Musculoskeletal: No edema, cyanosis, or clubbing. ?Neuro: Alert, answering all questions appropriately.  Cranial nerves grossly intact. ?Skin: No rashes or petechiae noted. ?Psych: Normal affect. ? ? ?LAB RESULTS: ? ?Lab Results  ?Component Value Date  ? NA 138 10/20/2021  ? K 4.0 10/20/2021  ? CL 107 10/20/2021  ? CO2 24 10/20/2021  ? GLUCOSE 103 (H) 10/20/2021  ? BUN <5 (L) 10/20/2021  ? CREATININE 0.77 10/20/2021  ? CALCIUM 9.0 10/20/2021  ? PROT  7.9 10/20/2021  ? ALBUMIN 4.2 10/20/2021  ? AST 31 10/20/2021  ? ALT 15 10/20/2021  ? ALKPHOS 69 10/20/2021  ? BILITOT 0.7 10/20/2021  ? GFRNONAA >60 10/20/2021  ? ? ?Lab Results  ?Component Value Date  ? WBC 4.7 02/28/2022  ? NEUTROABS 1.9 02/28/2022  ? HGB 12.0 02/28/2022  ? HCT 37.0 02/28/2022  ? MCV 82.4 02/28/2022  ? PLT 299 02/28/2022  ? ?Lab Results  ?Component Value Date  ? IRON 75 02/28/2022  ? TIBC 309 02/28/2022  ? IRONPCTSAT 24 02/28/2022  ? ?Lab Results  ?Component Value Date  ? FERRITIN 38 02/28/2022  ? ? ? ?STUDIES: ?No results found. ? ?ASSESSMENT: Iron deficiency anemia. ? ?PLAN:   ? ?1.  Iron deficiency anemia: Resolved.  Likely secondary to heavy menses.  Patient's hemoglobin and iron stores are now within normal limits.  Previously, B12, folate, and hemolysis labs were also negative or within normal limits.  Patient will benefit from IV Venofer.  Patient last received IV Venofer on November 24, 2021.  No intervention is needed at this time.  Return to clinic in 4 months with repeat laboratory work, further evaluation, consideration of IV Venofer if needed.  ?2.  Shortness of breath: Resolved.  Continue evaluation and treatment per primary care. ?3.  Thrombocytosis: Resolved. ? ?I spent a total of 20 minutes reviewing chart data, face-to-face evaluation with the patient, counseling and coordination of care as detailed above. ? ? ?Patient expressed understanding and was in agreement with this plan. She also understands that She can call clinic at any time with any questions, concerns, or complaints.  ? ? ?Lloyd Huger, MD   03/01/2022 2:19 PM ? ? ? ? ?

## 2022-03-05 ENCOUNTER — Encounter: Payer: Self-pay | Admitting: Oncology

## 2022-03-05 ENCOUNTER — Ambulatory Visit: Payer: Medicaid Other | Admitting: Family Medicine

## 2022-03-09 ENCOUNTER — Emergency Department: Payer: Medicaid Other

## 2022-03-09 ENCOUNTER — Other Ambulatory Visit: Payer: Self-pay

## 2022-03-09 ENCOUNTER — Emergency Department
Admission: EM | Admit: 2022-03-09 | Discharge: 2022-03-09 | Disposition: A | Discharge status: Home / Self Care | Payer: Medicaid Other | Attending: Emergency Medicine | Admitting: Emergency Medicine

## 2022-03-09 DIAGNOSIS — M79642 Pain in left hand: Secondary | ICD-10-CM | POA: Diagnosis present

## 2022-03-09 MED ORDER — CEPHALEXIN 500 MG PO CAPS: 500.0000 mg | ORAL_CAPSULE | Freq: Four times a day (QID) | ORAL | 0 refills | Status: AC

## 2022-03-09 NOTE — ED Notes (Signed)
31 yof with a c/c of left index finger numbness for two weeks. The pt denies any injury but does advise she has had limited mobility in her finger.  ?

## 2022-03-09 NOTE — ED Notes (Signed)
Patient declined discharge vital signs. 

## 2022-03-09 NOTE — ED Triage Notes (Signed)
Pt c/o left index finger pain that radiates into the hand for the past 2 weeks, denies injury.,  ?

## 2022-03-09 NOTE — ED Provider Notes (Signed)
? ?[facility] ?Provider Note ? ?Patient Contact: 3:27 PM (approximate) ? ? ?History  ? ?Hand Pain ? ? ?HPI ? ?Katie Kidd is a 31 y.o. female presents to the emergency department with right index finger pain that has occurred for the past 2 weeks.  Patient has some mild erythema along the dorsal aspect of the right index finger.  She has pain along both the dorsal and the volar aspect of the digit.  No fusiform swelling is appreciated and patient has no pain with passive extension.  She denies fever chills or history of cellulitis. ? ?  ? ? ?Physical Exam  ? ?Triage Vital Signs: ?ED Triage Vitals [03/09/22 1438]  ?Enc Vitals Group  ?   BP 109/74  ?   Pulse Rate 96  ?   Resp 16  ?   Temp 98.9 ?F (37.2 ?C)  ?   Temp Source Oral  ?   SpO2 100 %  ?   Weight   ?   Height   ?   Head Circumference   ?   Peak Flow   ?   Pain Score   ?   Pain Loc   ?   Pain Edu?   ?   Excl. in GC?   ? ? ?Most recent vital signs: ?Vitals:  ? 03/09/22 1438  ?BP: 109/74  ?Pulse: 96  ?Resp: 16  ?Temp: 98.9 ?F (37.2 ?C)  ?SpO2: 100%  ? ? ? ?General: Alert and in no acute distress. ?Eyes:  PERRL. EOMI. ?Head: No acute traumatic findings ?ENT: ?     Ears:  ?     Nose: No congestion/rhinnorhea. ?     Mouth/Throat: Mucous membranes are moist. ?Neck: No stridor. No cervical spine tenderness to palpation. ?Cardiovascular:  Good peripheral perfusion ?Respiratory: Normal respiratory effort without tachypnea or retractions. Lungs CTAB. Good air entry to the bases with no decreased or absent breath sounds. ?Gastrointestinal: Bowel sounds ?4 quadrants. Soft and nontender to palpation. No guarding or rigidity. No palpable masses. No distention. No CVA tenderness. ?Musculoskeletal: Patient has right index finger pain.  She has tenderness to palpation along the dorsal and the volar aspect of the digit.  Capillary refill less than 2 seconds on the right.  Palpable radial ulnar pulses bilaterally and symmetrically. ?Neurologic:  No  gross focal neurologic deficits are appreciated.  ?Skin:   No rash noted ?Other: ? ? ?ED Results / Procedures / Treatments  ? ?Labs ?(all labs ordered are listed, but only abnormal results are displayed) ?Labs Reviewed - No data to display ? ? ? ? ?RADIOLOGY ? ?I personally viewed and evaluated these images as part of my medical decision making, as well as reviewing the written report by the radiologist. ? ?ED Provider Interpretation:  ? ? ?PROCEDURES: ? ?Critical Care performed: No ? ?Procedures ? ? ?MEDICATIONS ORDERED IN ED: ?Medications - No data to display ? ? ?IMPRESSION / MDM / ASSESSMENT AND PLAN / ED COURSE  ?I reviewed the triage vital signs and the nursing notes. ?             ?               ? ?Differential diagnosis includes, but is not limited to, fracture, hand cellulitis, flexor tenosynovitis... ? ?Assessment and plan:  ?Hand pain:  ?31 year old female presents to the emergency department with left index finger pain for the past several weeks.  Patient does have some very mild erythema along the  dorsal aspect of the left second digit concerning for possible cellulitis.  Patient has no evidence of osteomyelitis or acute fracture on x-ray.  Patient had no noticeable flexor or extensor tendon injuries rigid no fusiform swelling or pain with passive extension to suggest flexor tenosynovitis.  I did give patient referral to hand specialist Dr. Stephenie Acres if symptoms do not seem to be improving after trial of Keflex. ?  ? ? ?FINAL CLINICAL IMPRESSION(S) / ED DIAGNOSES  ? ?Final diagnoses:  ?Pain of left hand  ? ? ? ?Rx / DC Orders  ? ?ED Discharge Orders   ? ?      Ordered  ?  cephALEXin (KEFLEX) 500 MG capsule  4 times daily       ? 03/09/22 1607  ? ?  ?  ? ?  ? ? ? ?Note:  This document was prepared using Dragon voice recognition software and may include unintentional dictation errors. ?  ?Orvil Feil, PA-C ?03/09/22 1612 ? ?  ?Merwyn Katos, MD ?03/09/22 1638 ? ?

## 2022-03-09 NOTE — Discharge Instructions (Signed)
Take Keflex four times daily for the next seven days.  

## 2022-07-02 ENCOUNTER — Inpatient Hospital Stay: Payer: Medicaid Other | Attending: Oncology

## 2022-07-02 DIAGNOSIS — D509 Iron deficiency anemia, unspecified: Secondary | ICD-10-CM | POA: Diagnosis present

## 2022-07-02 LAB — CBC WITH DIFFERENTIAL/PLATELET
Abs Immature Granulocytes: 0.01 10*3/uL (ref 0.00–0.07)
Basophils Absolute: 0 10*3/uL (ref 0.0–0.1)
Basophils Relative: 0 %
Eosinophils Absolute: 0.2 10*3/uL (ref 0.0–0.5)
Eosinophils Relative: 4 %
HCT: 38.4 % (ref 36.0–46.0)
Hemoglobin: 12.5 g/dL (ref 12.0–15.0)
Immature Granulocytes: 0 %
Lymphocytes Relative: 43 %
Lymphs Abs: 2.1 10*3/uL (ref 0.7–4.0)
MCH: 27.1 pg (ref 26.0–34.0)
MCHC: 32.6 g/dL (ref 30.0–36.0)
MCV: 83.1 fL (ref 80.0–100.0)
Monocytes Absolute: 0.3 10*3/uL (ref 0.1–1.0)
Monocytes Relative: 7 %
Neutro Abs: 2.2 10*3/uL (ref 1.7–7.7)
Neutrophils Relative %: 46 %
Platelets: 309 10*3/uL (ref 150–400)
RBC: 4.62 MIL/uL (ref 3.87–5.11)
RDW: 12.1 % (ref 11.5–15.5)
WBC: 4.8 10*3/uL (ref 4.0–10.5)
nRBC: 0 % (ref 0.0–0.2)

## 2022-07-02 LAB — FERRITIN: Ferritin: 10 ng/mL — ABNORMAL LOW (ref 11–307)

## 2022-07-02 LAB — IRON AND TIBC
Iron: 85 ug/dL (ref 28–170)
Saturation Ratios: 21 % (ref 10.4–31.8)
TIBC: 409 ug/dL (ref 250–450)
UIBC: 324 ug/dL

## 2022-07-03 ENCOUNTER — Inpatient Hospital Stay: Payer: Medicaid Other

## 2022-07-03 VITALS — BP 107/65 | HR 93 | Temp 98.7°F | Resp 20

## 2022-07-03 DIAGNOSIS — D509 Iron deficiency anemia, unspecified: Secondary | ICD-10-CM

## 2022-07-03 MED ORDER — SODIUM CHLORIDE 0.9 % IV SOLN
200.0000 mg | Freq: Once | INTRAVENOUS | Status: AC
  Administered 2022-07-03: 200 mg via INTRAVENOUS
  Filled 2022-07-03: qty 200

## 2022-07-03 MED ORDER — SODIUM CHLORIDE 0.9 % IV SOLN
Freq: Once | INTRAVENOUS | Status: AC
  Filled 2022-07-03: qty 250

## 2023-01-04 IMAGING — CR DG CHEST 2V
2 series · 2 of 2 positions shown · non-contrast
Comparison: Chest radiograph, 03/27/2021.  CT chest, 03/26/2021.

CLINICAL DATA: Chest pain.

EXAM:
CHEST - 2 VIEW

[chest pa]
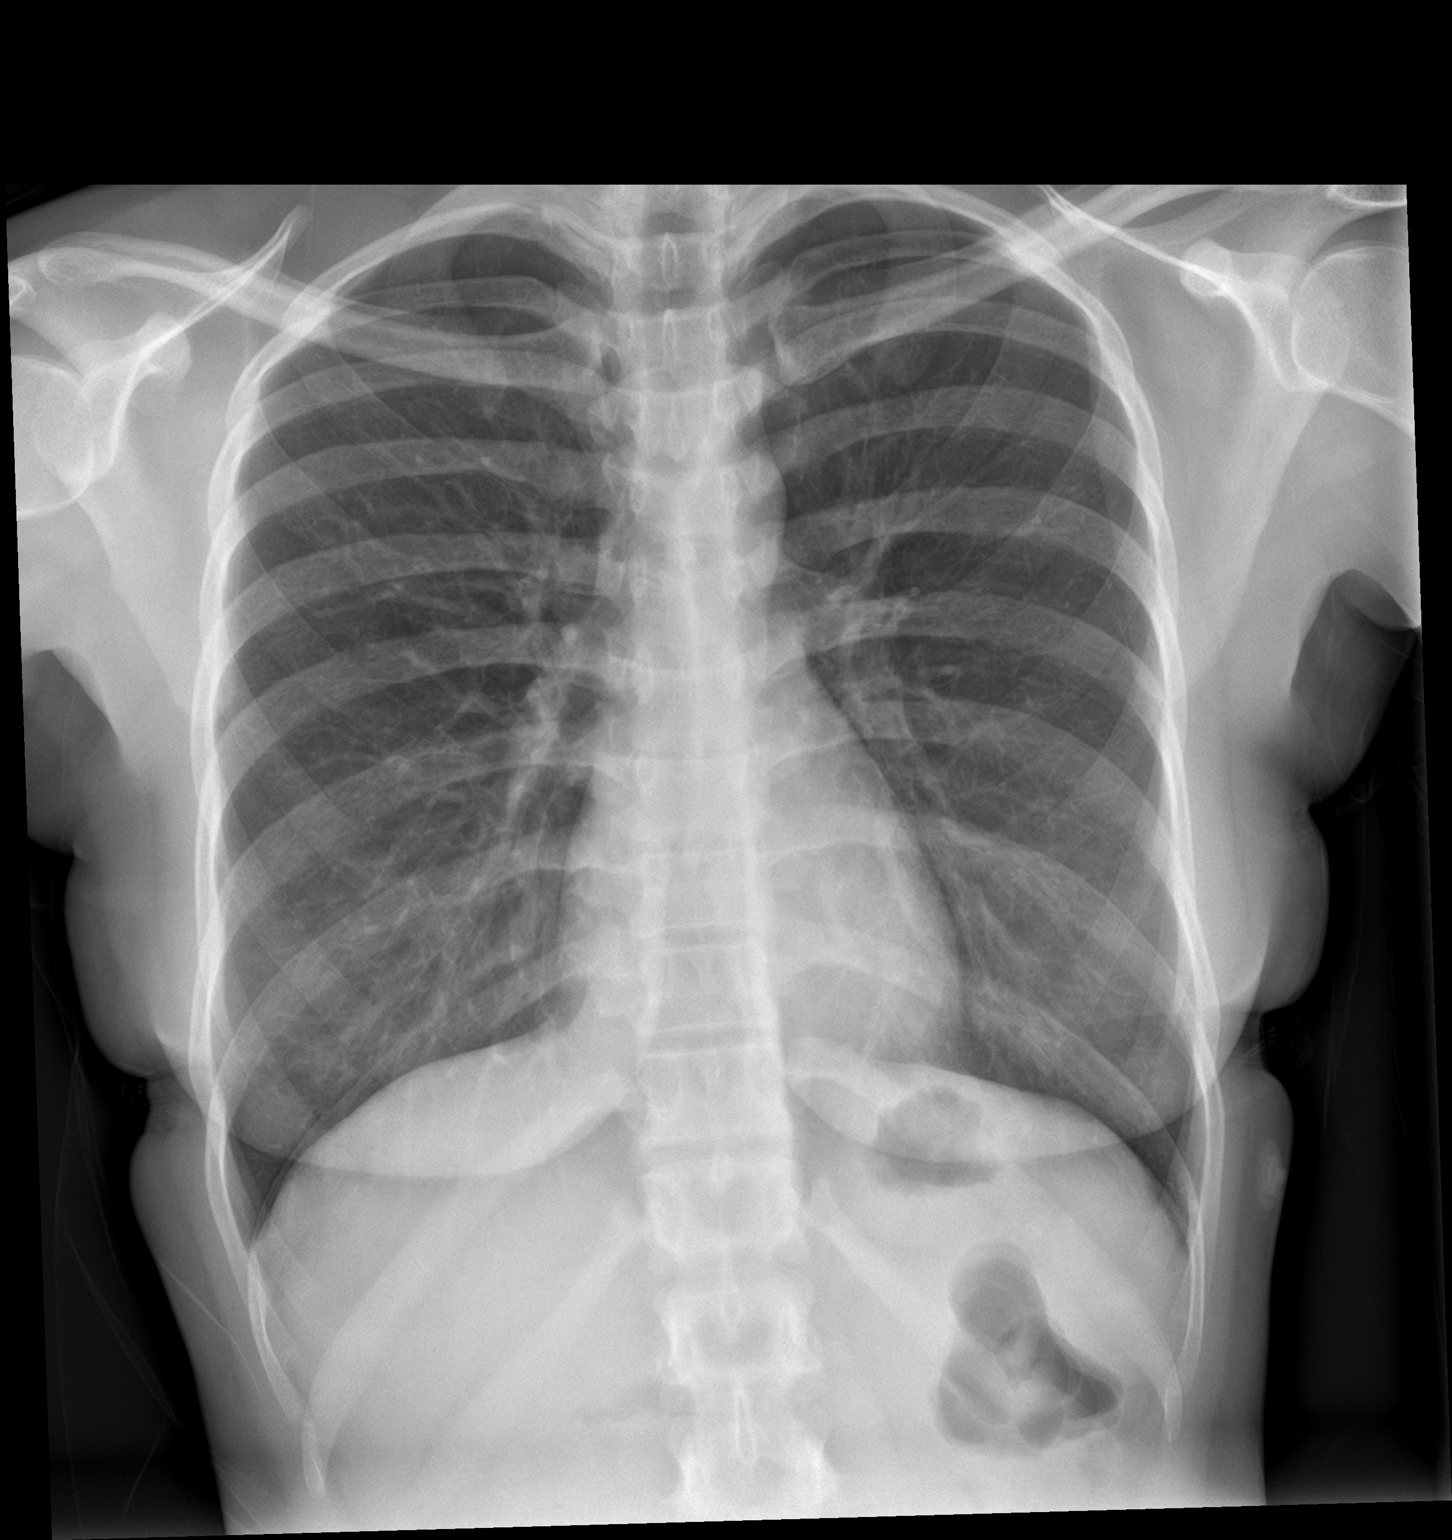

[chest lat]
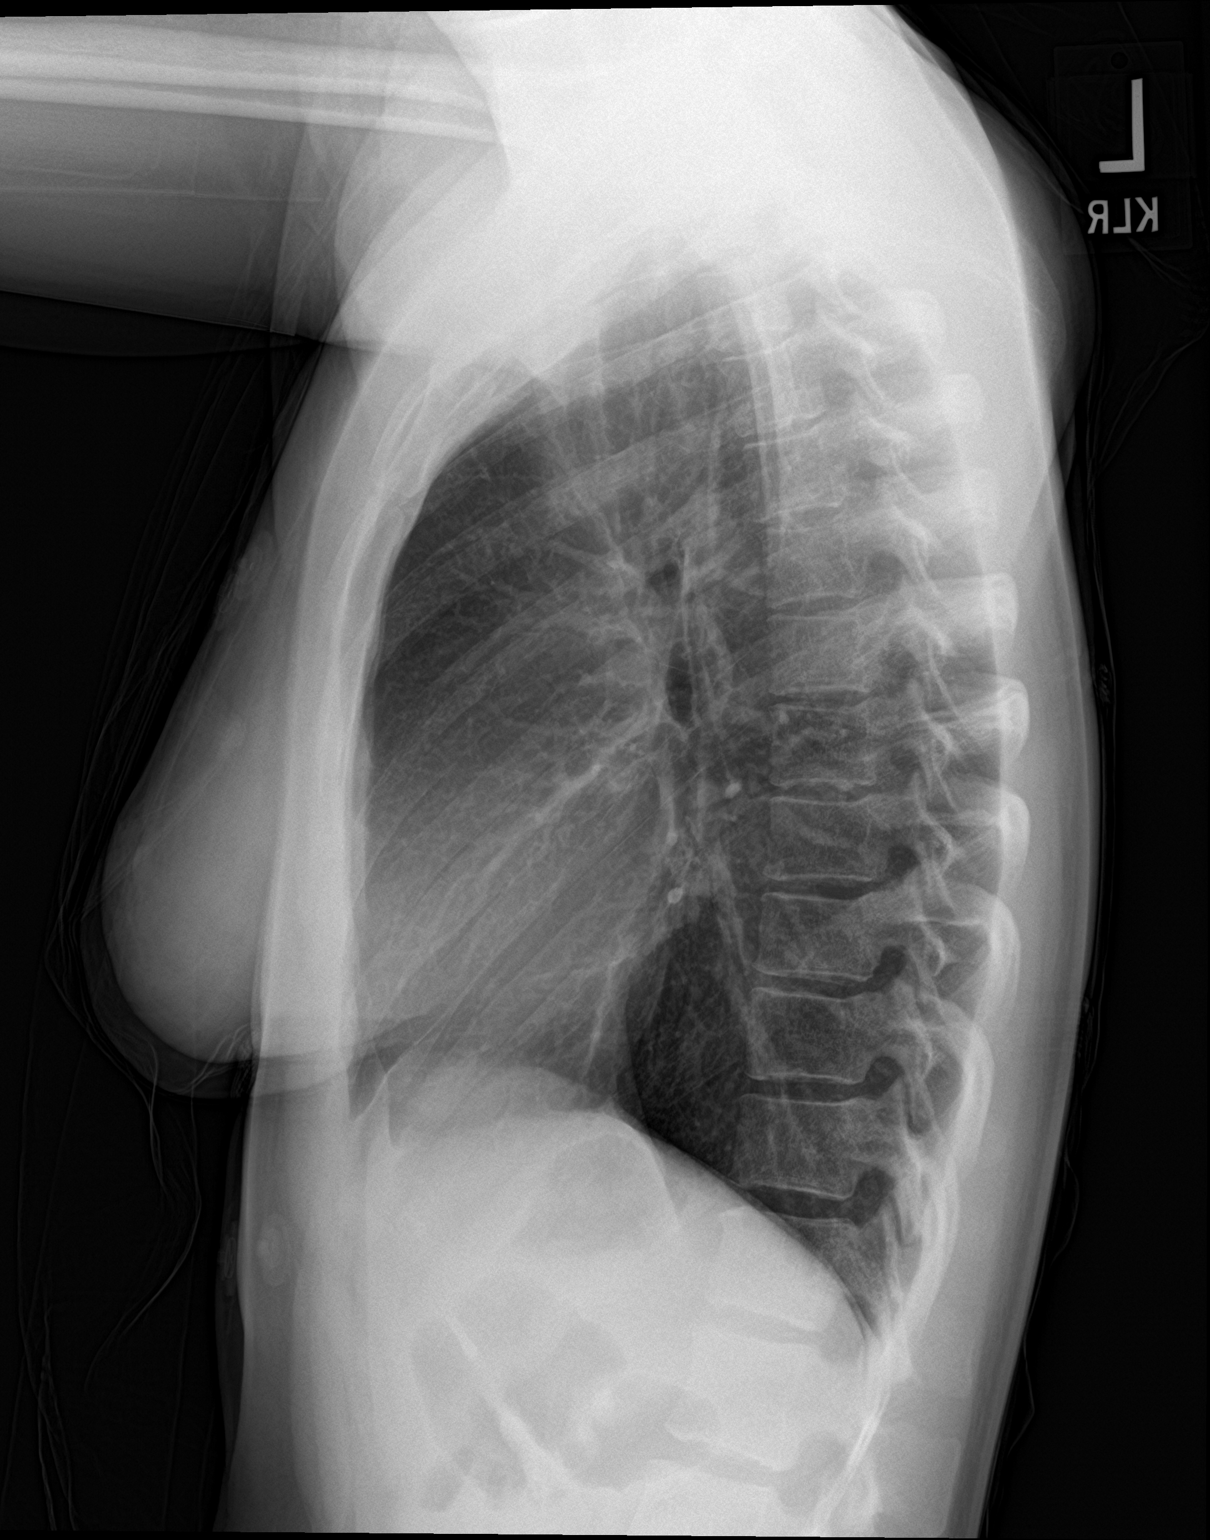

[2 of 2 positions shown; findings below may reference images not displayed]

FINDINGS: Cardiomediastinal silhouette is within normal limits. Lungs are well
inflated. No focal consolidation. No pleural effusion or. No acute
osseous abnormality.
IMPRESSION: Normal chest.

## 2023-05-16 ENCOUNTER — Encounter: Payer: Self-pay | Admitting: Oncology

## 2023-05-16 ENCOUNTER — Telehealth: Payer: Self-pay

## 2023-05-16 NOTE — Transitions of Care (Post Inpatient/ED Visit) (Signed)
   05/16/2023  Name: Katie Kidd MRN: [id number] DOB: 30-Mar-1991  Today's TOC FU Call Status: Today's TOC FU Call Status:: Successful TOC FU Call Competed TOC FU Call Complete Date: 05/16/23  Transition Care Management Follow-up Telephone Call Date of Discharge: 05/15/23 Discharge Facility: Other ([facility]) Name of Other ([facility]) Discharge Facility: [facility] Type of Discharge: Emergency Department Reason for ED Visit: Other: (Abd pain) How have you been since you were released from the hospital?: Same Any questions or concerns?: No  Items Reviewed: Did you receive and understand the discharge instructions provided?: Yes Medications obtained,verified, and reconciled?: Yes (Medications Reviewed) Any new allergies since your discharge?: No Dietary orders reviewed?: NA Do you have support at home?: Yes People in Home: spouse Name of Support/Comfort Primary Source: Katie Kidd  Medications Reviewed Today: Medications Reviewed Today     Reviewed by Everitt Amber (Medical Assistant) on 05/16/23 at 1136  Med List Status: <None>   Medication Order Taking? Sig Documenting Provider Last Dose Status Informant  acetaminophen (TYLENOL) 500 MG tablet [id number] Yes Take 500 mg by mouth every 6 (six) hours as needed for moderate pain, mild pain, fever or headache. [provider] Taking Active Self  albuterol (VENTOLIN HFA) 108 (90 Base) MCG/ACT inhaler [id number] Yes Inhale 2 puffs into the lungs every 6 (six) hours as needed for wheezing or shortness of breath. Don Perking, [place], MD Taking Active   ondansetron (ZOFRAN-ODT) 4 MG disintegrating tablet [id number] Yes Take by mouth. [provider] Taking Active             Home Care and Equipment/Supplies: Were Home Health Services Ordered?: NA Any new equipment or medical supplies ordered?: NA  Functional Questionnaire: Do you need assistance with bathing/showering or dressing?: No Do you need assistance  with meal preparation?: No Do you need assistance with eating?: No Do you have difficulty maintaining continence: No Do you need assistance with getting out of bed/getting out of a chair/moving?: No Do you have difficulty managing or taking your medications?: No  Follow up appointments reviewed: PCP Follow-up appointment confirmed?: Yes Date of PCP follow-up appointment?: 05/22/23 Follow-up Provider: [facility] Follow-up appointment confirmed?: NA Do you need transportation to your follow-up appointment?: No Do you understand care options if your condition(s) worsen?: Yes-patient verbalized understanding    SIGNATURE Arthur Holms

## 2023-05-22 ENCOUNTER — Ambulatory Visit: Payer: 59 | Admitting: Family Medicine

## 2023-05-22 ENCOUNTER — Encounter: Payer: Self-pay | Admitting: Family Medicine

## 2023-06-04 ENCOUNTER — Encounter: Payer: Self-pay | Admitting: Family Medicine

## 2023-08-11 IMAGING — DX DG FINGER INDEX 2+V*L*
3 series · 3 of 3 positions shown · non-contrast
Comparison: None.

CLINICAL DATA: Left index finger pain without known injury.

EXAM:
LEFT INDEX FINGER 2+V

[finger ap]
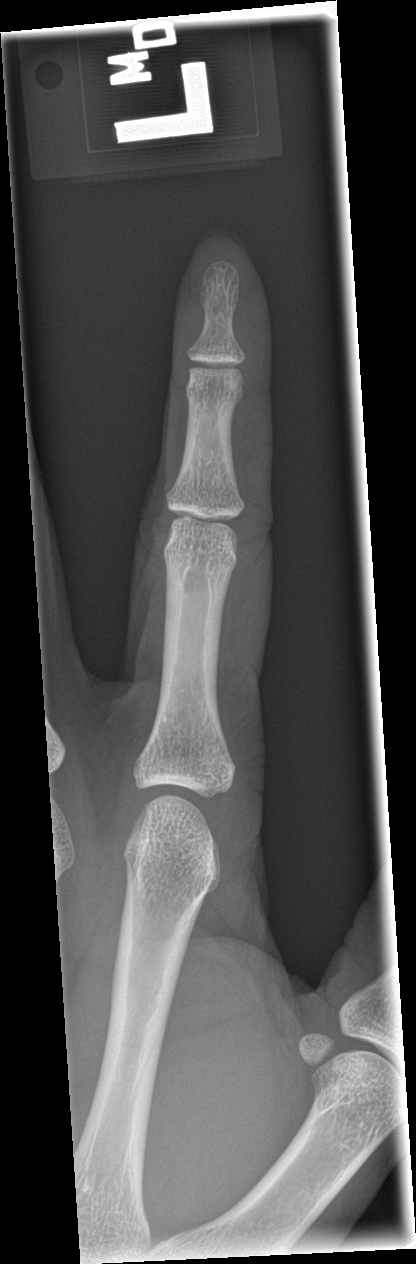

[finger obl]
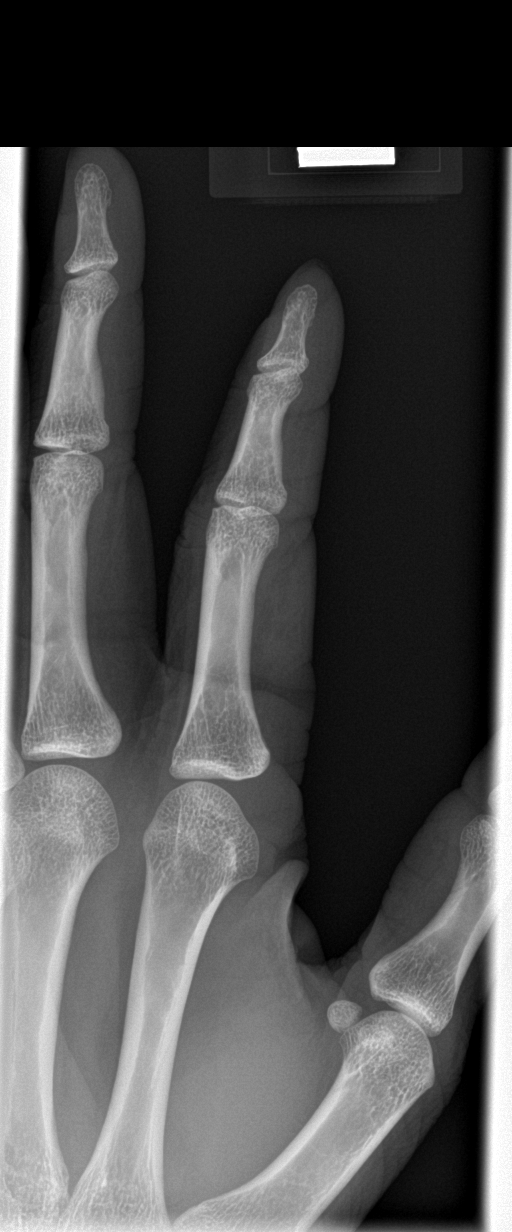

[finger lat]
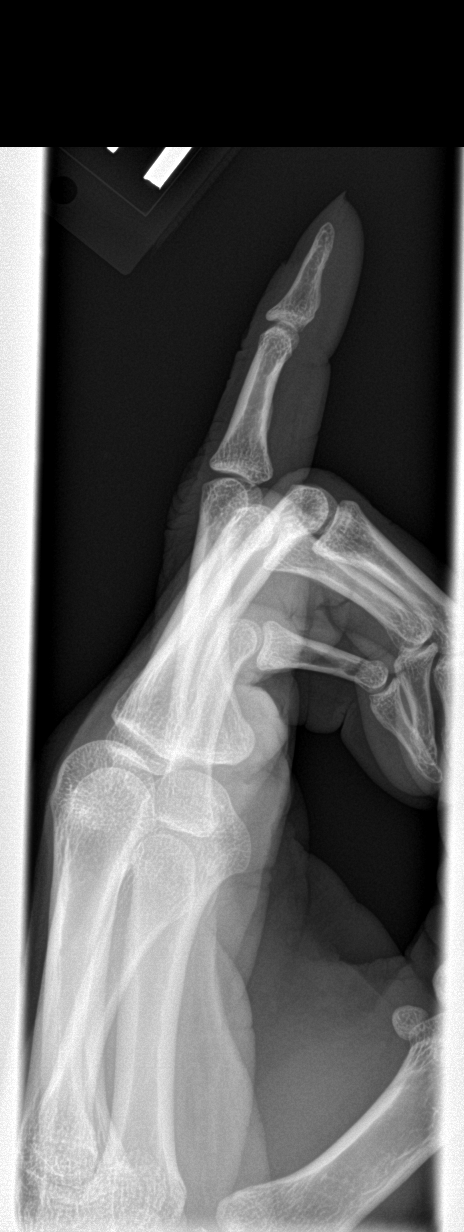

[3 of 3 positions shown; findings below may reference images not displayed]

FINDINGS: There is no evidence of fracture or dislocation. There is no
evidence of arthropathy or other focal bone abnormality. Soft
tissues are unremarkable.
IMPRESSION: Negative.

## 2024-07-31 ENCOUNTER — Encounter: Payer: Self-pay | Admitting: Oncology
